# Patient Record
Sex: Female | Born: 1985 | Race: Black or African American | Hispanic: No | Marital: Single | Smoking: Former smoker
Health system: Southern US, Community
[De-identification: ages and names within clinical notes are randomized; demographics above are authoritative.]

## PROBLEM LIST (undated history)

## (undated) DIAGNOSIS — R87629 Unspecified abnormal cytological findings in specimens from vagina: Secondary | ICD-10-CM

## (undated) DIAGNOSIS — Z8619 Personal history of other infectious and parasitic diseases: Secondary | ICD-10-CM

## (undated) DIAGNOSIS — B999 Unspecified infectious disease: Secondary | ICD-10-CM

## (undated) DIAGNOSIS — J302 Other seasonal allergic rhinitis: Secondary | ICD-10-CM

## (undated) DIAGNOSIS — T7840XA Allergy, unspecified, initial encounter: Secondary | ICD-10-CM

## (undated) DIAGNOSIS — R519 Headache, unspecified: Secondary | ICD-10-CM

## (undated) HISTORY — DX: Personal history of other infectious and parasitic diseases: Z86.19

## (undated) HISTORY — PX: WISDOM TOOTH EXTRACTION: SHX21

## (undated) HISTORY — DX: Allergy, unspecified, initial encounter: T78.40XA

## (undated) HISTORY — PX: COLONOSCOPY: SHX174

## (undated) HISTORY — PX: OTHER SURGICAL HISTORY: SHX169

## (undated) HISTORY — PX: NO PAST SURGERIES: SHX2092

## (undated) HISTORY — DX: Headache, unspecified: R51.9

---

## 2015-05-27 ENCOUNTER — Encounter (HOSPITAL_COMMUNITY): Payer: Self-pay | Admitting: *Deleted

## 2015-06-03 ENCOUNTER — Ambulatory Visit (HOSPITAL_COMMUNITY)
Admission: RE | Admit: 2015-06-03 | Discharge: 2015-06-03 | Disposition: A | Discharge status: Home / Self Care | Payer: Self-pay | Source: Ambulatory Visit | Attending: Obstetrics and Gynecology | Admitting: Obstetrics and Gynecology

## 2015-06-03 ENCOUNTER — Encounter (HOSPITAL_COMMUNITY): Payer: Self-pay

## 2015-06-03 VITALS — BP 100/62 | Temp 98.2°F | Ht 66.0 in | Wt 119.0 lb

## 2015-06-03 DIAGNOSIS — R87612 Low grade squamous intraepithelial lesion on cytologic smear of cervix (LGSIL): Secondary | ICD-10-CM

## 2015-06-03 DIAGNOSIS — Z1239 Encounter for other screening for malignant neoplasm of breast: Secondary | ICD-10-CM

## 2015-06-03 HISTORY — DX: Other seasonal allergic rhinitis: J30.2

## 2015-06-03 NOTE — Patient Instructions (Addendum)
Educational materials on self breast awareness given. Explained the colposcopy to Beverly Stewart the needed follow up for her abnormal Pap smear on 05/02/15. [facility]. Appointment scheduled for Thursday, June 30, 2015 at 1245.Patient aware of appointment and will be there. Let patient know that a screening mammogram is recommended at age 30 unless clinically indicated prior. Beverly Stewart verbalized understanding.  Brannock, Kathaleen Maserhristine Poll, RN 11:08 AM

## 2015-06-03 NOTE — Progress Notes (Signed)
[facility] due to having an abnormal Pap smear on 05/02/2015 that a colposcopy is recommended for follow up.   Pap Smear:  Pap smear not completed today. Last Pap smear was 05/02/2015 at the free cervical cancer screening at the [facility] and [facility]. Ref[facility] for a colposcopy. Appointment scheduled for Thursday, June 30, 2015 at 1245. Per patient has no history of an abnormal Pap smear prior to the most recent Pap smear. Last Pap smear result is in EPIC.  Physical exam: Breasts Breasts symmetrical. No skin abnormalities bilateral breasts. No nipple retraction bilateral breasts. No nipple discharge bilateral breasts. No lymphadenopathy. No lumps palpated bilateral breasts. No complaints of pain or tenderness on exam. Screening mammogram recommended at age 30 unless clinically indicated prior.  Pelvic/Bimanual No Pap smear completed today since last Pap smear was 05/02/2015. Pap smear not indicated per BCCCP guidelines.   Smoking History: Patient has never smoked.  Patient Navigation: Patient education provided. Access to services provided for [facility] through [facility].

## 2015-06-15 ENCOUNTER — Encounter (HOSPITAL_COMMUNITY): Payer: Self-pay | Admitting: *Deleted

## 2015-06-30 ENCOUNTER — Encounter: Payer: Self-pay | Admitting: General Practice

## 2015-06-30 ENCOUNTER — Encounter: Payer: Self-pay | Admitting: Family Medicine

## 2015-06-30 ENCOUNTER — Ambulatory Visit (INDEPENDENT_AMBULATORY_CARE_PROVIDER_SITE_OTHER): Payer: Self-pay | Admitting: Family Medicine

## 2015-06-30 ENCOUNTER — Other Ambulatory Visit (HOSPITAL_COMMUNITY)
Admission: RE | Admit: 2015-06-30 | Discharge: 2015-06-30 | Disposition: A | Discharge status: Home / Self Care | Payer: Self-pay | Source: Ambulatory Visit | Attending: Family Medicine | Admitting: Family Medicine

## 2015-06-30 VITALS — Temp 98.6°F

## 2015-06-30 DIAGNOSIS — R87622 Low grade squamous intraepithelial lesion on cytologic smear of vagina (LGSIL): Secondary | ICD-10-CM

## 2015-06-30 DIAGNOSIS — N870 Mild cervical dysplasia: Secondary | ICD-10-CM | POA: Insufficient documentation

## 2015-06-30 LAB — POCT PREGNANCY, URINE: PREG TEST UR: NEGATIVE

## 2015-06-30 NOTE — Progress Notes (Signed)
PAP: [facility] Patient given informed consent, signed copy in the chart, time out was performed.  Placed in lithotomy position. Cervix viewed with speculum and colposcope after application of acetic acid.   Colposcopy adequate?  Yes - TMZ seen 360 with endocervical speculum Acetowhite lesions?6 and 12 Punctation?fine Mosaicism?  no Abnormal vasculature?  no Biopsies?6&12 ECC?yes  Patient was given post procedure instructions.

## 2015-06-30 NOTE — Patient Instructions (Signed)
COLPOSCOPY POST-PROCEDURE INSTRUCTIONS  1. You may take Ibuprofen, Aleve or Tylenol for cramping if needed.  2. If Monsel's solution was used, you will have a black discharge.  3. Light bleeding is normal.  If bleeding is heavier than your period, please call.  4. Put nothing in your vagina until the bleeding or discharge stops (usually 2 or3 days).  5. We will call you within one week with biopsy results or discuss the results at your follow-up appointment if needed. 6.  

## 2015-07-14 ENCOUNTER — Telehealth: Payer: Self-pay | Admitting: *Deleted

## 2015-07-14 NOTE — Telephone Encounter (Signed)
Called patient with colpo results, informed her to f/u in one year with PAP/cotesting. Understanding voiced.

## 2015-07-14 NOTE — Telephone Encounter (Signed)
-----   Message from Levie HeritageJacob J Stinson, DO sent at 07/06/2015  8:30 AM EDT ----- Regarding: RE: colpo results She will need a PAP in 1 year with HPV testing.  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 How do you want to proceed? Thanks! Liborio NixonJanice

## 2015-10-02 ENCOUNTER — Emergency Department (HOSPITAL_COMMUNITY): Payer: Self-pay

## 2015-10-02 ENCOUNTER — Emergency Department (HOSPITAL_COMMUNITY)
Admission: EM | Admit: 2015-10-02 | Discharge: 2015-10-02 | Disposition: A | Discharge status: Home / Self Care | Payer: Self-pay | Attending: Emergency Medicine | Admitting: Emergency Medicine

## 2015-10-02 ENCOUNTER — Encounter (HOSPITAL_COMMUNITY): Payer: Self-pay | Admitting: *Deleted

## 2015-10-02 DIAGNOSIS — R0789 Other chest pain: Secondary | ICD-10-CM | POA: Insufficient documentation

## 2015-10-02 DIAGNOSIS — Z5181 Encounter for therapeutic drug level monitoring: Secondary | ICD-10-CM | POA: Insufficient documentation

## 2015-10-02 DIAGNOSIS — R000 Tachycardia, unspecified: Secondary | ICD-10-CM | POA: Insufficient documentation

## 2015-10-02 DIAGNOSIS — F17200 Nicotine dependence, unspecified, uncomplicated: Secondary | ICD-10-CM | POA: Insufficient documentation

## 2015-10-02 LAB — CBC
HCT: 41.6 % (ref 36.0–46.0)
HEMOGLOBIN: 14.1 g/dL (ref 12.0–15.0)
MCH: 30.5 pg (ref 26.0–34.0)
MCHC: 33.9 g/dL (ref 30.0–36.0)
MCV: 89.8 fL (ref 78.0–100.0)
PLATELETS: 248 10*3/uL (ref 150–400)
RBC: 4.63 MIL/uL (ref 3.87–5.11)
RDW: 12.5 % (ref 11.5–15.5)
WBC: 6.1 10*3/uL (ref 4.0–10.5)

## 2015-10-02 LAB — COMPREHENSIVE METABOLIC PANEL
ALK PHOS: 50 U/L (ref 38–126)
ALT: 9 U/L — ABNORMAL LOW (ref 14–54)
ANION GAP: 10 (ref 5–15)
AST: 21 U/L (ref 15–41)
Albumin: 4.6 g/dL (ref 3.5–5.0)
BUN: 15 mg/dL (ref 6–20)
CALCIUM: 9.5 mg/dL (ref 8.9–10.3)
CO2: 22 mmol/L (ref 22–32)
Chloride: 107 mmol/L (ref 101–111)
Creatinine, Ser: 0.88 mg/dL (ref 0.44–1.00)
Glucose, Bld: 103 mg/dL — ABNORMAL HIGH (ref 65–99)
Potassium: 3.2 mmol/L — ABNORMAL LOW (ref 3.5–5.1)
Sodium: 139 mmol/L (ref 135–145)
Total Bilirubin: 1.9 mg/dL — ABNORMAL HIGH (ref 0.3–1.2)
Total Protein: 7.9 g/dL (ref 6.5–8.1)

## 2015-10-02 LAB — RAPID URINE DRUG SCREEN, HOSP PERFORMED
AMPHETAMINES: NOT DETECTED
BARBITURATES: NOT DETECTED
Benzodiazepines: NOT DETECTED
COCAINE: NOT DETECTED
Opiates: NOT DETECTED
TETRAHYDROCANNABINOL: POSITIVE — AB

## 2015-10-02 LAB — PREGNANCY, URINE: PREG TEST UR: NEGATIVE

## 2015-10-02 LAB — ETHANOL: Alcohol, Ethyl (B): <5 mg/dL (ref <5)

## 2015-10-02 MED ORDER — LORAZEPAM 1 MG PO TABS
1.0000 mg | ORAL_TABLET | Freq: Once | ORAL | Status: AC
  Administered 2015-10-02: 1 mg via ORAL
  Filled 2015-10-02: qty 1

## 2015-10-02 MED ORDER — SODIUM CHLORIDE 0.9 % IV SOLN
1000.0000 mL | Freq: Once | INTRAVENOUS | Status: AC
  Administered 2015-10-02: 1000 mL via INTRAVENOUS

## 2015-10-02 MED ORDER — ALBUTEROL SULFATE (2.5 MG/3ML) 0.083% IN NEBU: 5.0000 mg | INHALATION_SOLUTION | Freq: Once | RESPIRATORY_TRACT | Status: DC

## 2015-10-02 MED ORDER — SODIUM CHLORIDE 0.9 % IV SOLN: 1000.0000 mL | INTRAVENOUS | Status: DC

## 2015-10-02 NOTE — ED Notes (Signed)
Patient cannot give urine sample at this time. Patient will try after some fluids.

## 2015-10-02 NOTE — ED Notes (Addendum)
Pt states she feels like her heart is racing and short of breath, HR 131. Pt went out last night, brought a cookie home, ate it this morning, went to store, was talking to friend, felt strange, vague recall of driving home, called mother to come to hospital.

## 2015-10-02 NOTE — ED Notes (Signed)
Pt reminded of need for urine specimen 

## 2015-10-02 NOTE — ED Provider Notes (Signed)
CSN: [id number]     Arrival date & time 10/02/15  1225 History   First MD Initiated Contact with Patient 10/02/15 1233     Chief Complaint  Patient presents with  . Tachycardia    131 HR  . Shortness of Breath     (Consider location/radiation/quality/duration/timing/severity/associated sxs/prior Treatment) HPI   This is a 30 year old female who presents for evaluation of feeling like her heart is racing. The patient reports feeling starting to feel "strange" this morning after eating a cookie she brought home from a party the night before. She has never experienced this before. Does not know if others at the party had similar symptoms. Denies family history of early sudden cardiac death.    Past Medical History  Diagnosis Date  . Seasonal allergies    History reviewed. No pertinent past surgical history. Family History  Problem Relation Age of Onset  . Diabetes Father   . Hypertension Maternal Grandmother   . Diabetes Maternal Grandmother   . Stroke Maternal Grandfather    Social History  Substance Use Topics  . Smoking status: Current Some Day Smoker  . Smokeless tobacco: Never Used  . Alcohol Use: Yes     Comment: ocassionally   OB History    Gravida Para Term Preterm AB TAB SAB Ectopic Multiple Living   Review of Systems  Constitutional: Negative for fever and chills.  HENT: Negative for rhinorrhea.   Eyes: Negative for photophobia and redness.  Respiratory: Positive for chest tightness and shortness of breath. Negative for cough and wheezing.   Cardiovascular: Positive for chest pain and palpitations.  Gastrointestinal: Negative for nausea, vomiting, abdominal pain, diarrhea and constipation.  Genitourinary: Negative for dysuria and difficulty urinating.  Psychiatric/Behavioral: Positive for decreased concentration. Negative for confusion and agitation. The patient is nervous/anxious.       Allergies  Review of patient's allergies indicates  no known allergies.  Home Medications   Prior to Admission medications   Not on File   BP 156/90 mmHg  Pulse 138  Temp(Src) 98 F (36.7 C) (Oral)  Resp 22  SpO2 100%  LMP 09/25/2015 Physical Exam  Constitutional: She is oriented to person, place, and time. She appears well-developed and well-nourished.  HENT:  Head: Normocephalic and atraumatic.  Eyes: Pupils are equal, round, and reactive to light. Left eye exhibits no discharge.  Pupils 3mm  Cardiovascular:  Tachycardic, rate 140's. No murmur.  Pulmonary/Chest: Effort normal and breath sounds normal. No respiratory distress. She has no wheezes. She has no rales.  Abdominal: Bowel sounds are normal.  Neurological: She is alert and oriented to person, place, and time.  Skin: Skin is warm and dry. She is not diaphoretic.    ED Course  Procedures (including critical care time) Labs Review Labs Reviewed - No data to display  Imaging Review Dg Chest 2 View  10/02/2015  CLINICAL DATA:  Pt states she feels like her heart is racing and short of breath, HR 131. Pt went out last night, brought a cookie home, ate it this morning, went to store, was talking to friend, felt strange, vague recall of driving home. EXAM: CHEST  2 VIEW COMPARISON:  None. FINDINGS: The heart size and mediastinal contours are within normal limits. Both lungs are clear. The visualized skeletal structures are unremarkable. IMPRESSION: No active cardiopulmonary disease. Electronically Signed   By: Elige Ko   On: 10/02/2015 13:28  I have personally reviewed and evaluated these images and lab results as part of my medical decision-making.   EKG Interpretation None      MDM   Final diagnoses:  Tachycardia        Shanna Un, DO 10/02/15 1533

## 2015-10-02 NOTE — ED Notes (Signed)
RN is starting an IV line and drawing blood work 

## 2015-10-02 NOTE — ED Notes (Signed)
Patient states that she can not go and needs more fluids.

## 2015-10-02 NOTE — ED Provider Notes (Signed)
I saw and evaluated the patient, reviewed the resident's note and I agree with the findings and plan.   EKG Interpretation None     ED ECG REPORT   Date: 10/02/2015  Rate: 136  Rhythm: sinus tachycardia  QRS Axis: normal  Intervals: normal  ST/T Wave abnormalities: nonspecific ST changes  Conduction Disutrbances:none  Narrative Interpretation:   Old EKG Reviewed: none available  I have personally reviewed the EKG tracing and agree with the computerized printout as noted.  Patient here for sudden onset of tachycardia and shortness of breath after eating a brownie. Possibly laced with illicit drugs. Has some chest tightness which she contributes to be anxious. Will treat with IV fluids and Ativan as well as check drug screen.  Lorre NickAnthony Taia Bramlett, MD 10/02/15 1316

## 2016-04-26 ENCOUNTER — Ambulatory Visit (INDEPENDENT_AMBULATORY_CARE_PROVIDER_SITE_OTHER): Payer: 59 | Admitting: Gastroenterology

## 2016-04-26 ENCOUNTER — Encounter: Payer: Self-pay | Admitting: Gastroenterology

## 2016-04-26 ENCOUNTER — Other Ambulatory Visit (INDEPENDENT_AMBULATORY_CARE_PROVIDER_SITE_OTHER): Payer: 59

## 2016-04-26 ENCOUNTER — Encounter (INDEPENDENT_AMBULATORY_CARE_PROVIDER_SITE_OTHER): Payer: Self-pay

## 2016-04-26 VITALS — BP 108/74 | HR 100 | Ht 65.75 in | Wt 119.4 lb

## 2016-04-26 DIAGNOSIS — R197 Diarrhea, unspecified: Secondary | ICD-10-CM

## 2016-04-26 DIAGNOSIS — R1033 Periumbilical pain: Secondary | ICD-10-CM | POA: Diagnosis not present

## 2016-04-26 LAB — CBC WITH DIFFERENTIAL/PLATELET
Basophils Absolute: 0 10*3/uL (ref 0.0–0.1)
Basophils Relative: 0.5 % (ref 0.0–3.0)
Eosinophils Absolute: 0.3 10*3/uL (ref 0.0–0.7)
Eosinophils Relative: 3.7 % (ref 0.0–5.0)
HEMATOCRIT: 40.5 % (ref 36.0–46.0)
HEMOGLOBIN: 13.4 g/dL (ref 12.0–15.0)
LYMPHS PCT: 20.2 % (ref 12.0–46.0)
Lymphs Abs: 1.9 10*3/uL (ref 0.7–4.0)
MCHC: 33 g/dL (ref 30.0–36.0)
MCV: 92 fl (ref 78.0–100.0)
MONOS PCT: 7.5 % (ref 3.0–12.0)
Monocytes Absolute: 0.7 10*3/uL (ref 0.1–1.0)
Neutro Abs: 6.5 10*3/uL (ref 1.4–7.7)
Neutrophils Relative %: 68.1 % (ref 43.0–77.0)
Platelets: 234 10*3/uL (ref 150.0–400.0)
RBC: 4.41 Mil/uL (ref 3.87–5.11)
RDW: 13 % (ref 11.5–15.5)
WBC: 9.5 10*3/uL (ref 4.0–10.5)

## 2016-04-26 LAB — COMPREHENSIVE METABOLIC PANEL
ALBUMIN: 4.2 g/dL (ref 3.5–5.2)
ALK PHOS: 47 U/L (ref 39–117)
ALT: 7 U/L (ref 0–35)
AST: 12 U/L (ref 0–37)
BILIRUBIN TOTAL: 0.8 mg/dL (ref 0.2–1.2)
BUN: 15 mg/dL (ref 6–23)
CO2: 27 mEq/L (ref 19–32)
Calcium: 9.4 mg/dL (ref 8.4–10.5)
Chloride: 110 mEq/L (ref 96–112)
Creatinine, Ser: 0.8 mg/dL (ref 0.40–1.20)
GFR: 109.07 mL/min (ref >60.00)
Glucose, Bld: 103 mg/dL — ABNORMAL HIGH (ref 70–99)
POTASSIUM: 4 meq/L (ref 3.5–5.1)
Sodium: 141 mEq/L (ref 135–145)
TOTAL PROTEIN: 7 g/dL (ref 6.0–8.3)

## 2016-04-26 LAB — IGA: IgA: 188 mg/dL (ref 68–378)

## 2016-04-26 LAB — TSH: TSH: 0.68 u[IU]/mL (ref 0.35–4.50)

## 2016-04-26 MED ORDER — NA SULFATE-K SULFATE-MG SULF 17.5-3.13-1.6 GM/177ML PO SOLN: 1.0000 | Freq: Once | ORAL | 0 refills | Status: AC

## 2016-04-26 MED ORDER — HYOSCYAMINE SULFATE 0.125 MG SL SUBL: 0.1250 mg | SUBLINGUAL_TABLET | Freq: Four times a day (QID) | SUBLINGUAL | 2 refills | Status: DC | PRN

## 2016-04-26 NOTE — Progress Notes (Signed)
[name]:  History: Beverly Stewart 04/26/2016  Referring physician: No primary care provider on file.  Reason for consult/chief complaint: Bloated; Diarrhea; and stomach burning (really bad burning after eating in lower abd)   Subjective  HPI:  This is a 31 year old woman self-referred for about a year of abdominal pain. It occurs frequently, might last several days or a week, and is of a burning quality in the entire infraumbilical area. When it occurs she will often have diarrhea and there may be urgency and bloating but no rectal bleeding. Her appetite is been good and she believes her weight is stable. She had no preceding triggers such as sick contacts, indications or travel. She has noticed no clear or consistent triggers.   ROS:  Review of Systems  Constitutional: Negative for appetite change and unexpected weight change.  HENT: Negative for mouth sores and voice change.   Eyes: Negative for pain and redness.  Respiratory: Negative for cough and shortness of breath.   Cardiovascular: Negative for chest pain and palpitations.  Genitourinary: Negative for dysuria and hematuria.  Musculoskeletal: Negative for arthralgias and myalgias.  Skin: Negative for pallor and rash.  Neurological: Negative for weakness and headaches.  Hematological: Negative for adenopathy.     Past Medical History: Past Medical History:  Diagnosis Date  . Seasonal allergies      Past Surgical History: Past Surgical History:  Procedure Laterality Date  . NO PAST SURGERIES       Family History: Family History  Problem Relation Age of Onset  . Diabetes Father   . Hypertension Maternal Grandmother   . Diabetes Maternal Grandmother   . Kidney failure Maternal Grandmother     dialysis  . Stroke Maternal Grandfather   . Colon cancer Maternal Grandfather     mets   No known IBD  Social History: Social History   Social History  . Marital status: Single     Spouse name: N/A  . Number of children: 0  . Years of education: N/A   Occupational History  . [employer]    Social History Main Topics  . Smoking status: Former Smoker    Quit date: 04/27/2015  . Smokeless tobacco: Never Used  . Alcohol use Yes     Comment: ocassionally  . Drug use: No  . Sexual activity: Yes    Birth control/ protection: None, Condom   Other Topics Concern  . None   Social History Narrative  . None    Allergies: No Known Allergies  Outpatient Meds: Current Outpatient Prescriptions  Medication Sig Dispense Refill  . DM-Phenylephrine-Acetaminophen (ROBITUSSIN COLD+FLU DAYTIME PO) Take 1 Dose by mouth every 4 (four) hours.    . hyoscyamine (LEVSIN SL) 0.125 MG SL tablet Place 1 tablet (0.125 mg total) under the tongue every 6 (six) hours as needed. 30 tablet 2  . Na Sulfate-K Sulfate-Mg Sulf 17.5-3.13-1.6 GM/180ML SOLN Take 1 kit by mouth once. 354 mL 0   No current facility-administered medications for this visit.       ___________________________________________________________________ Objective   Exam:  BP 108/74 (BP Location: Left Arm, Patient Position: Sitting, Cuff Size: Normal)   Pulse 100   Ht 5' 5.75" (1.67 m) Comment: height measured without shoes  Wt 119 lb 6 oz (54.1 kg)   LMP 03/27/2016   BMI 19.42 kg/m    General: this is a(n) petite and thin young woman   Eyes: sclera anicteric, no redness  ENT: oral mucosa moist without lesions, no cervical  or supraclavicular lymphadenopathy, good dentition  CV: RRR without murmur, S1/S2, no JVD, no peripheral edema  Resp: clear to auscultation bilaterally, normal RR and effort noted  GI: soft, no tenderness, with active bowel sounds. No guarding or palpable organomegaly noted.  Skin; warm and dry, no rash or jaundice noted  Neuro: awake, alert and oriented x 3. Normal gross motor function and fluent speech  No data to review, she does not have a PCP and has had no  workup.   Assessment: Encounter Diagnoses  Name Primary?  . Diarrhea, unspecified type Yes  . Periumbilical abdominal pain     Symptoms somewhat difficult to characterize, sounds most likely IBS, less likely IBD or sprue.   Plan:  CBC, CMP, TTG antibody Trial of hyoscyamine Colonoscopy  Thank you for the courtesy of this consult.  Please call me with any questions or concerns.  Nelida Meuse III  CC: No primary care provider on file.

## 2016-04-26 NOTE — Patient Instructions (Signed)
If you are age 29 or older, your body mass index should be between 23-30. Your Body mass index is 19.42 kg/m. If this is out of the aforementioned range listed, please consider follow up with your Primary Care Provider.  If you are age 29 or younger, your body mass index should be between 19-25. Your Body mass index is 19.42 kg/m. If this is out of the aformentioned range listed, please consider follow up with your Primary Care Provider.   You have been scheduled for a colonoscopy. Please follow written instructions given to you at your visit today.  Please pick up your prep supplies at the pharmacy within the next 1-3 days. If you use inhalers (even only as needed), please bring them with you on the day of your procedure. Your physician has requested that you go to www.startemmi.com and enter the access code given to you at your visit today. This web site gives a general overview about your procedure. However, you should still follow specific instructions given to you by our office regarding your preparation for the procedure.  Thank you for choosing me and [facility].  Dr. Ellwood DenseH. Danis

## 2016-04-27 LAB — TISSUE TRANSGLUTAMINASE, IGA: TISSUE TRANSGLUTAMINASE AB, IGA: 1 U/mL (ref <4)

## 2016-04-30 NOTE — Progress Notes (Signed)
New prep instructions sent to patient.

## 2016-05-17 ENCOUNTER — Encounter: Payer: Self-pay | Admitting: Gastroenterology

## 2016-05-31 ENCOUNTER — Encounter: Payer: 59 | Admitting: Gastroenterology

## 2016-06-14 ENCOUNTER — Telehealth: Payer: Self-pay | Admitting: Gastroenterology

## 2016-06-14 ENCOUNTER — Ambulatory Visit (HOSPITAL_COMMUNITY): Payer: 59

## 2016-06-14 NOTE — Telephone Encounter (Signed)
Patient advised to stay away from any food triggers, may take Tums to see if this helps with the burning sensation. She understands that until she has her colonoscopy Dr. Myrtie Neitheranis will not be able to offer further diagnosis.

## 2016-06-14 NOTE — Telephone Encounter (Signed)
Patient states that the Levsin had been helping, but since last Wednesday she states her stomach feels like "it's on fire", burning, only able to get about one hour of relief after taking the Levsin. She is taking it every 6 hours. She is not taking any other medications. She is able to eat, drink okay, is having bowel movements which swing from constipation to diarrhea to normal. She is scheduled for colonoscopy on 4/13. Please advise.

## 2016-06-14 NOTE — Telephone Encounter (Signed)
I'm afraid I do not know what else to offer until colonoscopy to make a more certain diagnosis.

## 2016-06-29 ENCOUNTER — Encounter: Payer: Self-pay | Admitting: Gastroenterology

## 2016-06-29 ENCOUNTER — Ambulatory Visit (AMBULATORY_SURGERY_CENTER): Payer: 59 | Admitting: Gastroenterology

## 2016-06-29 VITALS — BP 107/70 | HR 68 | Temp 99.6°F | Resp 16 | Ht 65.0 in | Wt 119.0 lb

## 2016-06-29 DIAGNOSIS — R1032 Left lower quadrant pain: Secondary | ICD-10-CM

## 2016-06-29 DIAGNOSIS — R197 Diarrhea, unspecified: Secondary | ICD-10-CM | POA: Diagnosis not present

## 2016-06-29 MED ORDER — SODIUM CHLORIDE 0.9 % IV SOLN: 500.0000 mL | INTRAVENOUS | Status: DC

## 2016-06-29 MED ORDER — CILIDINIUM-CHLORDIAZEPOXIDE 2.5-5 MG PO CAPS: 1.0000 | ORAL_CAPSULE | Freq: Two times a day (BID) | ORAL | 1 refills | Status: DC | PRN

## 2016-06-29 NOTE — Progress Notes (Signed)
To recovery, report to Hines, RN, VSS 

## 2016-06-29 NOTE — Patient Instructions (Signed)
YOU HAD AN ENDOSCOPIC PROCEDURE TODAY AT THE [facility]:   Refer to the procedure report that was given to you for any specific questions about what was found during the examination.  If the procedure report does not answer your questions, please call your gastroenterologist to clarify.  If you requested that your care partner not be given the details of your procedure findings, then the procedure report has been included in a sealed envelope for you to review at your convenience later.  YOU SHOULD EXPECT: Some feelings of bloating in the abdomen. Passage of more gas than usual.  Walking can help get rid of the air that was put into your GI tract during the procedure and reduce the bloating. If you had a lower endoscopy (such as a colonoscopy or flexible sigmoidoscopy) you may notice spotting of blood in your stool or on the toilet paper. If you underwent a bowel prep for your procedure, you may not have a normal bowel movement for a few days.  Please Note:  You might notice some irritation and congestion in your nose or some drainage.  This is from the oxygen used during your procedure.  There is no need for concern and it should clear up in a day or so.  SYMPTOMS TO REPORT IMMEDIATELY:   Following lower endoscopy (colonoscopy or flexible sigmoidoscopy):  Excessive amounts of blood in the stool  Significant tenderness or worsening of abdominal pains  Swelling of the abdomen that is new, acute  Fever of 100F or higher  For urgent or emergent issues, a gastroenterologist can be reached at any hour by calling [phone].   DIET:  We do recommend a small meal at first, but then you may proceed to your regular diet.  Drink plenty of fluids but you should avoid alcoholic beverages for 24 hours.  MEDICATIONS:  Use Librax one capsule by mouth twice daily as needed for symptoms. When first taking the medicine, take it at home when not working the remainder of the day in case of  sedation side effect. Prescription sent to patient's pharmacy. Otherwise, continue present medications.  ACTIVITY:  You should plan to take it easy for the rest of today and you should NOT DRIVE or use heavy machinery until tomorrow (because of the sedation medicines used during the test).    FOLLOW UP: Our staff will call the number listed on your records the next business day following your procedure to check on you and address any questions or concerns that you may have regarding the information given to you following your procedure. If we do not reach you, we will leave a message.  However, if you are feeling well and you are not experiencing any problems, there is no need to return our call.  We will assume that you have returned to your regular daily activities without incident.  If any biopsies were taken you will be contacted by phone or by letter within the next 1-3 weeks.  Please call us at [phone] if you have not heard about the biopsies in 3 weeks.   Please see handouts given to you by your recovery nurse.  Thank you for allowing [place] to provide for your healthcare needs today.  SIGNATURES/CONFIDENTIALITY: You and/or your care partner have signed paperwork which will be entered into your electronic medical record.  These signatures attest to the fact that that the information above on your After Visit Summary has been reviewed and is understood.  Full responsibility  of the confidentiality of this discharge information lies with you and/or your care-partner.

## 2016-06-29 NOTE — Op Note (Signed)
[facility] Patient Name: Beverly Stewart Procedure Date: 06/29/2016 4:09 PM MRN: [id number] Endoscopist: Sherilyn Cooter L. Myrtie Neither , MD Age: 31 Referring MD:  Date of Birth: April 14, 1985 Gender: Female Account #: [id number] Procedure:                Colonoscopy Indications:              Abdominal pain in the left lower quadrant, Chronic                            diarrhea Medicines:                Monitored Anesthesia Care Procedure:                Pre-Anesthesia Assessment:                           - Prior to the procedure, a History and Physical                            was performed, and patient medications and                            allergies were reviewed. The patient's tolerance of                            previous anesthesia was also reviewed. The risks                            and benefits of the procedure and the sedation                            options and risks were discussed with the patient.                            All questions were answered, and informed consent                            was obtained. Prior Anticoagulants: The patient has                            taken no previous anticoagulant or antiplatelet                            agents. ASA Grade Assessment: II - A patient with                            mild systemic disease. After reviewing the risks                            and benefits, the patient was deemed in                            satisfactory condition to undergo the procedure.  After obtaining informed consent, the colonoscope                            was passed under direct vision. Throughout the                            procedure, the patient's blood pressure, pulse, and                            oxygen saturations were monitored continuously. The                            Colonoscope was introduced through the anus and                            advanced to the the terminal ileum. The colonoscopy                            was performed without difficulty. The patient                            tolerated the procedure well. The quality of the                            bowel preparation was good. The terminal ileum,                            ileocecal valve, appendiceal orifice, and rectum                            were photographed. The quality of the bowel                            preparation was evaluated using the BBPS [facility]                            Bowel Preparation Scale) with scores of: Right                            Colon = 3, Transverse Colon = 3 and Left Colon = 3                            (entire mucosa seen well with no residual staining,                            small fragments of stool or opaque liquid). The                            total BBPS score equals 9. The bowel preparation                            used was SUPREP. Scope In: 4:22:08 PM Scope Out: 4:32:52 PM Scope Withdrawal Time: 0 hours  6 minutes 44 seconds  Total Procedure Duration: 0 hours 10 minutes 44 seconds  Findings:                 The perianal and digital rectal examinations were                            normal.                           The terminal ileum appeared normal.                           A few medium-mouthed diverticula were found in the                            right colon.                           The exam was otherwise without abnormality on                            direct and retroflexion views. Complications:            No immediate complications. Estimated Blood Loss:     Estimated blood loss: none. Impression:               - The examined portion of the ileum was normal.                           - Diverticulosis in the right colon.                           - The examination was otherwise normal on direct                            and retroflexion views.                           - No specimens collected. Recommendation:           - Patient has a contact  number available for                            emergencies. The signs and symptoms of potential                            delayed complications were discussed with the                            patient. Return to normal activities tomorrow.                            Written discharge instructions were provided to the                            patient.                           -  Resume previous diet.                           - Use Librax one capsule PO twice daily as needed                            for symptoms.                           Disp #30 tabs, one refill                           When first taking the medicine, take it at home                            when not working the remainder of the day in case                            of sedation side effect.                           Follow up in my clinic in a few weeks.                           - Continue present medications.                           - No recommendation at this time regarding repeat                            colonoscopy due to age. Kaine Mcquillen L. Myrtie Neither, MD 06/29/2016 4:40:19 PM This report has been signed electronically.

## 2016-06-29 NOTE — Progress Notes (Signed)
Patient stating colon results brown liquid with mushy. Unable to see through results in the toilet. Informed Dr. Myrtie Neither.

## 2016-07-02 ENCOUNTER — Telehealth: Payer: Self-pay | Admitting: *Deleted

## 2016-07-02 NOTE — Telephone Encounter (Signed)
  Follow up Call-  Call back number 06/29/2016  Post procedure Call Back phone  # [phone]  Permission to leave phone message Yes     Patient questions:  Do you have a fever, pain , or abdominal swelling? No. Pain Score  0 *  Have you tolerated food without any problems? Yes.    Have you been able to return to your normal activities? Yes.    Do you have any questions about your discharge instructions: Diet   No. Medications  No. Follow up visit  No.  Do you have questions or concerns about your Care? No.  Actions: * If pain score is 4 or above: No action needed, pain <4.

## 2016-07-05 ENCOUNTER — Ambulatory Visit (INDEPENDENT_AMBULATORY_CARE_PROVIDER_SITE_OTHER): Payer: 59 | Admitting: Student

## 2016-07-05 ENCOUNTER — Encounter: Payer: Self-pay | Admitting: Student

## 2016-07-05 VITALS — BP 116/78 | HR 72 | Ht 63.0 in | Wt 116.3 lb

## 2016-07-05 DIAGNOSIS — Z1151 Encounter for screening for human papillomavirus (HPV): Secondary | ICD-10-CM | POA: Diagnosis not present

## 2016-07-05 DIAGNOSIS — N870 Mild cervical dysplasia: Secondary | ICD-10-CM | POA: Insufficient documentation

## 2016-07-05 DIAGNOSIS — Z87410 Personal history of cervical dysplasia: Secondary | ICD-10-CM

## 2016-07-05 DIAGNOSIS — Z8742 Personal history of other diseases of the female genital tract: Secondary | ICD-10-CM

## 2016-07-05 DIAGNOSIS — Z124 Encounter for screening for malignant neoplasm of cervix: Secondary | ICD-10-CM

## 2016-07-05 NOTE — Patient Instructions (Signed)

## 2016-07-05 NOTE — Progress Notes (Addendum)
Patient ID: Beverly Stewart, female   DOB: 24-Jul-1985, 31 y.o.   MRN: [id number]  Chief Complaint  Patient presents with  . Gynecologic Exam    Pap smear    HPI Beverly Stewart is a 31 y.o. female presenting for a pap smear after colpo in April 2017.  HPI Requesting pap smear; patient has a history of abnormal pap smear in April 2017.  Past Medical History:  Diagnosis Date  . Allergy   . Seasonal allergies     Past Surgical History:  Procedure Laterality Date  . NO PAST SURGERIES      Family History  Problem Relation Age of Onset  . Diabetes Father   . Hypertension Maternal Grandmother   . Diabetes Maternal Grandmother   . Kidney failure Maternal Grandmother     dialysis  . Stroke Maternal Grandfather   . Colon cancer Maternal Grandfather     mets  . Breast cancer Other     Social History Social History  Substance Use Topics  . Smoking status: Former Smoker    Quit date: 04/27/2015  . Smokeless tobacco: Never Used  . Alcohol use Yes     Comment: ocassionally    No Known Allergies  Current Outpatient Prescriptions  Medication Sig Dispense Refill  . clidinium-chlordiazePOXIDE (LIBRAX) 5-2.5 MG capsule Take 1 capsule by mouth 2 (two) times daily as needed (as needed for symptoms). 30 capsule 1  . hyoscyamine (LEVSIN SL) 0.125 MG SL tablet Place 1 tablet (0.125 mg total) under the tongue every 6 (six) hours as needed. 30 tablet 2  . alum & mag hydroxide-simeth (MAALOX/MYLANTA) 200-200-20 MG/5ML suspension Take by mouth every 6 (six) hours as needed for indigestion or heartburn.    Marland Kitchen DM-Phenylephrine-Acetaminophen (ROBITUSSIN COLD+FLU DAYTIME PO) Take 1 Dose by mouth every 4 (four) hours.    . Multiple Vitamin (MULTIVITAMIN) tablet Take 1 tablet by mouth daily.     Current Facility-Administered Medications  Medication Dose Route Frequency Provider Last Rate Last Dose  . 0.9 %  sodium chloride infusion  500 mL Intravenous Continuous Charlie Pitter III, MD        Review of  Systems Review of Systems  Constitutional: Negative.   HENT: Negative.   Respiratory: Negative.   Cardiovascular: Negative.   Gastrointestinal: Negative.   Genitourinary: Negative.   Neurological: Negative.     Blood pressure 116/78, pulse 72, height  (1.6 m), weight 116 lb 4.8 oz (52.8 kg), last menstrual period 06/14/2016.  Physical Exam Physical Exam  Constitutional: She is oriented to person, place, and time. She appears well-developed and well-nourished.  HENT:  Head: Normocephalic.  Neck: Normal range of motion.  Pulmonary/Chest: Breath sounds normal.  Abdominal: Soft. Bowel sounds are normal.  Genitourinary: No vaginal discharge found.  Genitourinary Comments: NEFG; no vaginal lesions. Cervix is pink with no lesions; no CMT.   Musculoskeletal: Normal range of motion.  Neurological: She is alert and oriented to person, place, and time.  Skin: Skin is warm and dry.  Psychiatric: She has a normal mood and affect.    Assessment    Healthy GYN exam    Plan    1. Pap smear done today with HPV co-testing 2. Patient will follow up with [facility] for her well-woman care and STI screening       Charlesetta Garibaldi Emryn Flanery CNM 07/05/2016, 4:00 PM

## 2016-07-10 LAB — CYTOLOGY - PAP

## 2016-07-12 ENCOUNTER — Telehealth: Payer: Self-pay | Admitting: *Deleted

## 2016-07-12 NOTE — Telephone Encounter (Signed)
Called patient and spoke with her about pap results, need for colposcopy. Patient voiced understanding and would like the front office to call her to schedule. Will inbox them.

## 2016-07-12 NOTE — Telephone Encounter (Signed)
-----   Message from Beverly Stewart, CNM sent at 07/12/2016 10:22 AM EDT ----- Regarding: Patient needs colpo Hello! Do you mind calling this patient to schedule her for a follow-up colpo and go over her results. I tried to reach her today but no answer; I told her that someone would be calling her to go over her results and next steps. Thank you!  [name]

## 2016-07-13 ENCOUNTER — Telehealth: Payer: Self-pay | Admitting: General Practice

## 2016-07-30 ENCOUNTER — Encounter: Payer: Self-pay | Admitting: Family Medicine

## 2016-07-30 ENCOUNTER — Ambulatory Visit (INDEPENDENT_AMBULATORY_CARE_PROVIDER_SITE_OTHER): Payer: 59 | Admitting: Family Medicine

## 2016-07-30 VITALS — BP 121/67 | HR 89 | Wt 115.2 lb

## 2016-07-30 DIAGNOSIS — Z3202 Encounter for pregnancy test, result negative: Secondary | ICD-10-CM | POA: Diagnosis not present

## 2016-07-30 DIAGNOSIS — R87622 Low grade squamous intraepithelial lesion on cytologic smear of vagina (LGSIL): Secondary | ICD-10-CM

## 2016-07-30 LAB — POCT PREGNANCY, URINE: Preg Test, Ur: NEGATIVE

## 2016-07-30 NOTE — Progress Notes (Signed)
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  Placed in lithotomy position. Cervix viewed with speculum and colposcope after application of acetic acid.   Colposcopy adequate?  No. TMZ not seen Acetowhite lesions? 6, 12 o'clock Punctation? no Mosaicism?  no Abnormal vasculature?  no Biopsies? yes ECC? yes   Patient was given post procedure instructions.  Will call patient with results.

## 2016-07-30 NOTE — Patient Instructions (Signed)
Colposcopy, Care After This sheet gives you information about how to care for yourself after your procedure. Your doctor may also give you more specific instructions. If you have problems or questions, contact your doctor. What can I expect after the procedure? If you did not have a tissue sample removed (did not have a biopsy), you may only have some spotting for a few days. You can go back to your normal activities. If you had a tissue sample removed, it is common to have:  Soreness and pain. This may last for a few days.  Light-headedness.  Mild bleeding from your vagina or dark-colored, grainy discharge from your vagina. This may last for a few days. You may need to wear a sanitary pad.  Spotting for at least 48 hours after the procedure. Follow these instructions at home:  Take over-the-counter and prescription medicines only as told by your doctor. Ask your doctor what medicines you can start taking again. This is very important if you take blood-thinning medicine.  Do not drive or use heavy machinery while taking prescription pain medicine.  For 3 days, or as long as your doctor tells you, avoid:  Douching.  Using tampons.  Having sex.  If you use birth control (contraception), keep using it.  Limit activity for the first day after the procedure. Ask your doctor what activities are safe for you.  It is up to you to get the results of your procedure. Ask your doctor when your results will be ready.  Keep all follow-up visits as told by your doctor. This is important. Contact a doctor if:  You get a skin rash. Get help right away if:  You are bleeding a lot from your vagina. It is a lot of bleeding if you are using more than one pad an hour for 2 hours in a row.  You have clumps of blood (blood clots) coming from your vagina.  You have a fever.  You have chills  You have pain in your lower belly (pelvic area).  You have signs of infection, such as vaginal  discharge that is:  Different than usual.  Yellow.  Bad-smelling.  You have very pain or cramps in your lower belly that do not get better with medicine.  You feel light-headed.  You feel dizzy.  You pass out (faint). Summary  If you did not have a tissue sample removed (did not have a biopsy), you may only have some spotting for a few days. You can go back to your normal activities.  If you had a tissue sample removed, it is common to have mild pain and spotting for 48 hours.  For 3 days, or as long as your doctor tells you, avoid douching, using tampons and having sex.  Get help right away if you have bleeding, very bad pain, or signs of infection. This information is not intended to replace advice given to you by your health care provider. Make sure you discuss any questions you have with your health care provider. Document Released: 08/22/2007 Document Revised: 11/23/2015 Document Reviewed: 11/23/2015 Elsevier Interactive Patient Education  2017 Elsevier Inc.  

## 2016-07-31 ENCOUNTER — Other Ambulatory Visit (HOSPITAL_COMMUNITY)
Admission: RE | Admit: 2016-07-31 | Discharge: 2016-07-31 | Disposition: A | Discharge status: Home / Self Care | Payer: 59 | Source: Ambulatory Visit | Attending: Family Medicine | Admitting: Family Medicine

## 2016-07-31 DIAGNOSIS — R87619 Unspecified abnormal cytological findings in specimens from cervix uteri: Secondary | ICD-10-CM | POA: Diagnosis present

## 2016-07-31 NOTE — Addendum Note (Signed)
Addended by: Jill SideAY, Samhitha Rosen L on: 07/31/2016 11:56 AM   Modules accepted: Orders

## 2016-08-06 ENCOUNTER — Encounter: Payer: Self-pay | Admitting: Family Medicine

## 2017-03-25 ENCOUNTER — Ambulatory Visit: Payer: 59 | Admitting: Family Medicine

## 2017-04-08 ENCOUNTER — Encounter: Payer: Self-pay | Admitting: Family Medicine

## 2017-04-08 ENCOUNTER — Ambulatory Visit (INDEPENDENT_AMBULATORY_CARE_PROVIDER_SITE_OTHER): Payer: 59 | Admitting: Family Medicine

## 2017-04-08 VITALS — BP 119/66 | HR 72 | Wt 116.9 lb

## 2017-04-08 DIAGNOSIS — Z01419 Encounter for gynecological examination (general) (routine) without abnormal findings: Secondary | ICD-10-CM

## 2017-04-08 DIAGNOSIS — Z124 Encounter for screening for malignant neoplasm of cervix: Secondary | ICD-10-CM

## 2017-04-08 DIAGNOSIS — Z1151 Encounter for screening for human papillomavirus (HPV): Secondary | ICD-10-CM | POA: Diagnosis not present

## 2017-04-08 DIAGNOSIS — Z113 Encounter for screening for infections with a predominantly sexual mode of transmission: Secondary | ICD-10-CM

## 2017-04-08 DIAGNOSIS — N870 Mild cervical dysplasia: Secondary | ICD-10-CM

## 2017-04-08 NOTE — Progress Notes (Signed)
   Subjective:    Patient ID: Beverly Stewart, female    DOB: 1985-04-05, 32 y.o.   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 PAPs LSIL with colpo CIN1. No other complaints or problems.  Review of Systems     Objective:   Physical Exam  Constitutional: She appears well-developed and well-nourished.  Neck: Normal range of motion. Neck supple. No thyromegaly present.  Cardiovascular: Normal rate, regular rhythm and normal heart sounds.  Pulmonary/Chest: Effort normal and breath sounds normal. No respiratory distress. She has no wheezes. She has no rales.  Abdominal: Soft. Bowel sounds are normal. She exhibits no distension and no mass. There is no tenderness. There is no rebound and no guarding. Hernia confirmed negative in the right inguinal area and confirmed negative in the left inguinal area.  Genitourinary: There is no rash, tenderness, lesion or injury on the right labia. There is no rash, tenderness, lesion or injury on the left labia. Cervix exhibits no discharge and no friability. No tenderness in the vagina. No signs of injury around the vagina. No vaginal discharge found.  Lymphadenopathy:       Left: No inguinal adenopathy present.  Neurological: She is alert.  Skin: Skin is warm and dry.  Psychiatric: She has a normal mood and affect. Her behavior is normal. Thought content normal.      Assessment & Plan:  1. CIN I (cervical intraepithelial neoplasia I) - Cytology - PAP

## 2017-04-15 LAB — CYTOLOGY - PAP
Diagnosis: UNDETERMINED — AB
HPV 16/18/45 genotyping: NEGATIVE
HPV: DETECTED — AB

## 2017-05-03 ENCOUNTER — Telehealth: Payer: Self-pay | Admitting: General Practice

## 2017-05-03 NOTE — Telephone Encounter (Signed)
Patient called and left message requesting pap results.  Per Dr Adrian BlackwaterStinson, Patient has abnormal PAP - needs colposcopy.  Called patient, no answer- left message stating we are trying to reach you to return your phone call, please call us back.

## 2017-05-15 NOTE — Telephone Encounter (Signed)
Called patient & informed her of pap results & need for colposcopy. Informed patient of colpo appt. Patient verbalized understanding to all & had no questions

## 2017-05-20 ENCOUNTER — Encounter: Payer: Self-pay | Admitting: Family Medicine

## 2017-05-20 ENCOUNTER — Other Ambulatory Visit (HOSPITAL_COMMUNITY)
Admission: RE | Admit: 2017-05-20 | Discharge: 2017-05-20 | Disposition: A | Discharge status: Home / Self Care | Payer: 59 | Source: Ambulatory Visit | Attending: Family Medicine | Admitting: Family Medicine

## 2017-05-20 ENCOUNTER — Ambulatory Visit (INDEPENDENT_AMBULATORY_CARE_PROVIDER_SITE_OTHER): Payer: 59 | Admitting: Family Medicine

## 2017-05-20 ENCOUNTER — Encounter: Payer: Self-pay | Admitting: *Deleted

## 2017-05-20 VITALS — BP 128/84 | HR 74 | Wt 116.7 lb

## 2017-05-20 DIAGNOSIS — R87810 Cervical high risk human papillomavirus (HPV) DNA test positive: Secondary | ICD-10-CM | POA: Insufficient documentation

## 2017-05-20 DIAGNOSIS — R87610 Atypical squamous cells of undetermined significance on cytologic smear of cervix (ASC-US): Secondary | ICD-10-CM

## 2017-05-20 LAB — POCT PREGNANCY, URINE: Preg Test, Ur: NEGATIVE

## 2017-05-20 NOTE — Progress Notes (Signed)
Patient Name: Beverly Stewart, female   DOB: [birth year], 32 y.o.  MRN: [id number]  Colposcopy Procedure Note:  G1P0010 Pregnancy status: Unknown Indications: ASCUS, +HR HPV HPV:  Positive Cervical History:  Previous Abnormal Pap: LSIL 2018  Previous Colposcopy: 07/30/16 - CIN 1, neg ECC  Previous LEEP or Cryo: no  Smoking: Former Smoker Hysterectomy: No   Patient given informed consent, signed copy in the chart, time out was performed.    Exam: Vulva and Vagina grossly normal.  Cervix viewed with speculum and colposcope after application of acetic acid:  Cervix Fully Visualized Squamocolumnar Junction Visibility: Fully visualized  Acetowhite lesions: 6 and 12 o'clock Other Lesions: None Punctation: Fine  Mosaicism: Not present Abnormal vasculature: No  Biopsies: 6 and 12 o'clock ECC: Yes - Curette and Brush  Hemostasis achieved with:  Silver Nitrate  Colposcopy Impression:  CIN1   Patient was given post procedure instructions.  Will call patient with results.

## 2017-05-20 NOTE — Addendum Note (Signed)
Addended by: Levie HeritageSTINSON, Kealohilani Maiorino J on: 05/20/2017 03:44 PM   Modules accepted: Orders

## 2017-05-24 ENCOUNTER — Encounter: Payer: Self-pay | Admitting: Family Medicine

## 2018-06-03 ENCOUNTER — Telehealth: Payer: Self-pay | Admitting: Student

## 2018-06-03 NOTE — Telephone Encounter (Signed)
The patient called to schedule an annual appointment. Sending a reminder letter.

## 2018-06-23 ENCOUNTER — Ambulatory Visit: Payer: 59 | Admitting: Family Medicine

## 2018-08-21 ENCOUNTER — Telehealth: Payer: Self-pay | Admitting: Obstetrics and Gynecology

## 2018-08-21 NOTE — Telephone Encounter (Signed)
Called the patient to inform of upcoming appointment. The line answered and hung up. Mailing a appointment reminder.

## 2018-08-23 ENCOUNTER — Inpatient Hospital Stay (HOSPITAL_COMMUNITY)
Admission: AD | Admit: 2018-08-23 | Discharge: 2018-08-23 | Disposition: A | Discharge status: Home / Self Care | Payer: 59 | Attending: Obstetrics and Gynecology | Admitting: Obstetrics and Gynecology

## 2018-08-23 ENCOUNTER — Other Ambulatory Visit: Payer: Self-pay

## 2018-08-23 DIAGNOSIS — Z3202 Encounter for pregnancy test, result negative: Secondary | ICD-10-CM

## 2018-08-23 DIAGNOSIS — N939 Abnormal uterine and vaginal bleeding, unspecified: Secondary | ICD-10-CM | POA: Diagnosis not present

## 2018-08-23 LAB — POCT PREGNANCY, URINE: Preg Test, Ur: NEGATIVE

## 2018-08-23 NOTE — MAU Provider Note (Signed)
First Provider Initiated Contact with Patient 08/23/18 0945      S Ms. Fritzi Scripter is a 33 y.o. G1P0010 non-pregnant female who presents to [facility]U today with complaint of vaginal bleeding. States she hasn't "felt right" since having intercourse a few weeks ago. Started bleeding like a period this morning but states her period isn't supposed to start until 6/16. Had a negative pregnancy test yesterday.    O BP 125/67 (BP Location: Right Arm)   Pulse 88   Temp 98.8 F (37.1 C) (Oral)   Resp 18   LMP 07/29/2018 (Approximate)   SpO2 100%  Physical Exam  Nursing note and vitals reviewed. Constitutional: She appears well-developed and well-nourished. No distress.  HENT:  Head: Normocephalic and atraumatic.  Respiratory: Effort normal. No respiratory distress.  Skin: She is not diaphoretic.  Psychiatric: She has a normal mood and affect. Her behavior is normal. Judgment and thought content normal.    A Non pregnant female Medical screening exam complete 1. Negative pregnancy test   2. Vaginal bleeding      P Discharge from [facility]U in stable condition Patient given the option of transfer to [facility] for further evaluation or seek care in outpatient facility of choice List of options for follow-up given  Warning signs for worsening condition that would warrant emergency follow-up discussed Patient may return to MAU as needed for pregnancy related complaints  Jorje Guild, NP 08/23/2018 9:45 AM

## 2018-08-23 NOTE — MAU Note (Signed)
Beverly Stewart is a 33 y.o. here in MAU reporting: negative pregnancy test a few days ago. Been having cramping since last night and bleeding since this AM. Unsure if this is her period, states it would be early if it was her period.   LMP: 07/29/18 approximately  Onset of complaint: last night  Pain score: 3/10  Vitals:   08/23/18 0941  BP: 125/67  Pulse: 88  Resp: 18  Temp: 98.8 F (37.1 C)  SpO2: 100%     Lab orders placed from triage: UPT

## 2018-08-25 ENCOUNTER — Telehealth: Payer: Self-pay | Admitting: Emergency Medicine

## 2018-08-25 NOTE — Telephone Encounter (Signed)
Pt called and left a message on the nurse voicemail line stating that she has an annual appointment scheduled for 6/29 and she wants the appointment cancelled.

## 2018-09-15 ENCOUNTER — Ambulatory Visit: Payer: 59 | Admitting: Family Medicine

## 2018-09-25 ENCOUNTER — Telehealth: Payer: Self-pay | Admitting: Family Medicine

## 2018-09-25 NOTE — Telephone Encounter (Signed)
Attempted to call patient about her appointment on 7/10 @ 9:35. Per Jeanett Schlein flipped the patient's appointment time with another provider due to some scheduling concerns. Patient was scheduled for 7/10 @ 8:35. No answer, left voicemail with new appointment time. Patient instructed to wear a face mask and no visitors are allowed with her. Patient instructed to not attend the appointment if she has any symptoms. Symptom list left

## 2018-09-26 ENCOUNTER — Ambulatory Visit: Payer: 59 | Admitting: Family Medicine

## 2018-09-26 ENCOUNTER — Ambulatory Visit: Payer: 59 | Admitting: Women's Health

## 2018-10-02 ENCOUNTER — Inpatient Hospital Stay (HOSPITAL_COMMUNITY): Payer: 59

## 2018-10-02 ENCOUNTER — Other Ambulatory Visit: Payer: Self-pay

## 2018-10-02 ENCOUNTER — Telehealth: Payer: Self-pay | Admitting: Family Medicine

## 2018-10-02 ENCOUNTER — Encounter (HOSPITAL_COMMUNITY): Payer: Self-pay | Admitting: *Deleted

## 2018-10-02 ENCOUNTER — Inpatient Hospital Stay (HOSPITAL_COMMUNITY)
Admission: AD | Admit: 2018-10-02 | Discharge: 2018-10-02 | Disposition: A | Discharge status: Home / Self Care | Payer: 59 | Attending: Obstetrics and Gynecology | Admitting: Obstetrics and Gynecology

## 2018-10-02 DIAGNOSIS — Z833 Family history of diabetes mellitus: Secondary | ICD-10-CM | POA: Diagnosis not present

## 2018-10-02 DIAGNOSIS — Z3A01 Less than 8 weeks gestation of pregnancy: Secondary | ICD-10-CM | POA: Insufficient documentation

## 2018-10-02 DIAGNOSIS — Z6740 Type O blood, Rh positive: Secondary | ICD-10-CM | POA: Insufficient documentation

## 2018-10-02 DIAGNOSIS — O4690 Antepartum hemorrhage, unspecified, unspecified trimester: Secondary | ICD-10-CM

## 2018-10-02 DIAGNOSIS — O4691 Antepartum hemorrhage, unspecified, first trimester: Secondary | ICD-10-CM | POA: Diagnosis not present

## 2018-10-02 DIAGNOSIS — Z800 Family history of malignant neoplasm of digestive organs: Secondary | ICD-10-CM | POA: Diagnosis not present

## 2018-10-02 DIAGNOSIS — Z803 Family history of malignant neoplasm of breast: Secondary | ICD-10-CM | POA: Diagnosis not present

## 2018-10-02 DIAGNOSIS — O26891 Other specified pregnancy related conditions, first trimester: Secondary | ICD-10-CM | POA: Diagnosis not present

## 2018-10-02 DIAGNOSIS — Z87891 Personal history of nicotine dependence: Secondary | ICD-10-CM | POA: Diagnosis not present

## 2018-10-02 DIAGNOSIS — O209 Hemorrhage in early pregnancy, unspecified: Secondary | ICD-10-CM | POA: Insufficient documentation

## 2018-10-02 DIAGNOSIS — Z8279 Family history of other congenital malformations, deformations and chromosomal abnormalities: Secondary | ICD-10-CM | POA: Diagnosis not present

## 2018-10-02 HISTORY — DX: Unspecified infectious disease: B99.9

## 2018-10-02 HISTORY — DX: Unspecified abnormal cytological findings in specimens from vagina: R87.629

## 2018-10-02 LAB — CBC
HCT: 43 % (ref 36.0–46.0)
Hemoglobin: 13.9 g/dL (ref 12.0–15.0)
MCH: 30.2 pg (ref 26.0–34.0)
MCHC: 32.3 g/dL (ref 30.0–36.0)
MCV: 93.3 fL (ref 80.0–100.0)
Platelets: 215 10*3/uL (ref 150–400)
RBC: 4.61 MIL/uL (ref 3.87–5.11)
RDW: 12.4 % (ref 11.5–15.5)
WBC: 6.2 10*3/uL (ref 4.0–10.5)
nRBC: 0 % (ref 0.0–0.2)

## 2018-10-02 LAB — ABO/RH: ABO/RH(D): O POS

## 2018-10-02 LAB — HCG, QUANTITATIVE, PREGNANCY: hCG, Beta Chain, Quant, S: 121 m[IU]/mL — ABNORMAL HIGH (ref <5)

## 2018-10-02 MED ORDER — ACETAMINOPHEN 325 MG PO TABS
650.0000 mg | ORAL_TABLET | Freq: Once | ORAL | Status: AC
  Administered 2018-10-02: 650 mg via ORAL
  Filled 2018-10-02: qty 2

## 2018-10-02 NOTE — Telephone Encounter (Signed)
Patient came into the office stating that she is currently pregnant and bleeding heavily. Patient instructed that she needs to go to MAU to be evaluated. Address give and patient verbalized understanding.

## 2018-10-02 NOTE — Discharge Instructions (Signed)
Human Chorionic Gonadotropin Test °Why am I having this test? °A human chorionic gonadotropin (hCG) test is done to determine whether you are pregnant. It can also be used: °· To diagnose an abnormal pregnancy. °· To determine whether you have had a failed pregnancy (miscarriage) or are at risk of one. °What is being tested? °This test checks the level of the human chorionic gonadotropin (hCG) hormone in the blood. This hormone is produced during pregnancy by the cells that form the placenta. The placenta is the organ that grows inside your womb (uterus) to nourish a developing baby. When you are pregnant, hCG can be detected in your blood or urine 7 to 8 days before your missed period. It continues to go up for the first 8-10 weeks of pregnancy. °The presence of hCG in your blood can be measured with several different types of tests. You may have: °· A urine test. °? Because this hormone is eliminated from your body by your kidneys, you may have a urine test to find out whether you are pregnant. A home pregnancy test detects whether there is hCG in your urine. °? A urine test only shows whether there is hCG in your urine. It does not measure how much. °· A qualitative blood test. °? You may have this type of blood test to find out if you are pregnant. °? This blood test only shows whether there is hCG in your blood. It does not measure how much. °· A quantitative blood test. °? This type of blood test measures the amount of hCG in your blood. °? You may have this test to: °§ Diagnose an abnormal pregnancy. °§ Check whether you have had a miscarriage. °§ Determine whether you are at risk of a miscarriage. °What kind of sample is taken? ° °  ° °Two kinds of samples may be collected to test for the hCG hormone. °· Blood. It is usually collected by inserting a needle into a blood vessel. °· Urine. It is usually collected by urinating into a germ-free (sterile) specimen cup. It is best to collect the sample the first  time you urinate in the morning. °How do I prepare for this test? °No preparation is needed for a blood test.  °For the urine test: °· Let your health care provider know about: °? All medicines you are taking, including vitamins, herbs, creams, and over-the-counter medicines. °? Any blood in your urine. This may interfere with the result. °· Do not drink too much fluid. Drink as you normally would, or as directed by your health care provider. °How are the results reported? °Depending on the type of test that you have, your test results may be reported as values. Your health care provider will compare your results to normal ranges that were established after testing a large group of people (reference ranges). Reference ranges may vary among labs and hospitals. For this test, common reference ranges that show absence of pregnancy are: °· Quantitative hCG blood levels: less than 5 IU/L. °Other results will be reported as either positive or negative. For this test, normal results (meaning the absence of pregnancy) are: °· Negative for hCG in the urine test. °· Negative for hCG in the qualitative blood test. °What do the results mean? °Urine and qualitative blood test °· A negative result could mean: °? That you are not pregnant. °? That the test was done too early in your pregnancy to detect hCG in your blood or urine. If you still have other signs   of pregnancy, the test will be repeated. °· A positive result means: °? That you are most likely pregnant. Your health care provider may confirm your pregnancy with an imaging study (ultrasound) of your uterus, if needed. °Quantitative blood test °Results of the quantitative hCG blood test will be interpreted as follows: °· Less than 5 IU/L: You are most likely not pregnant. °· Greater than 25 IU/L: You are most likely pregnant. °· hCG levels that are higher than expected: °? You are pregnant with twins. °? You have abnormal growths in the uterus. °· hCG levels that are  rising more slowly than expected: °? You have an ectopic pregnancy (also called a tubal pregnancy). °· hCG levels that are falling: °? You may be having a miscarriage. °Talk with your health care provider about what your results mean. °Questions to ask your health care provider °Ask your health care provider, or the department that is doing the test: °· When will my results be ready? °· How will I get my results? °· What are my treatment options? °· What other tests do I need? °· What are my next steps? °Summary °· A human chorionic gonadotropin test is done to determine whether you are pregnant. °· When you are pregnant, hCG can be detected in your blood or urine 7 to 8 days before your missed period. It continues to go up for the first 8-10 weeks of pregnancy. °· Your hCG level can be measured with different types of tests. You may have a urine test, a qualitative blood test, or a quantitative blood test. °· Talk with your health care provider about what your results mean. °This information is not intended to replace advice given to you by your health care provider. Make sure you discuss any questions you have with your health care provider. °Document Released: 04/06/2004 Document Revised: 02/04/2017 Document Reviewed: 02/04/2017 °Elsevier Patient Education © 2020 [employer]vier Inc. ° °

## 2018-10-02 NOTE — MAU Note (Signed)
Has paperwork from [facility] with her, +test on 7/13

## 2018-10-02 NOTE — MAU Note (Signed)
Pt reports she [facility] a positive pregnancy [facility]. Started having heavy vaginal bleeding last night with mild to moderate cramping. Still bleeding this morning thought she saw small amount of something come out.

## 2018-10-02 NOTE — MAU Provider Note (Signed)
History     CSN: [id number]  Arrival date and time: 10/02/18 1048   None     Chief Complaint  Patient presents with  . Vaginal Bleeding   HPI Beverly Stewart is a 33 y.o. G1P0010 at Unknown [place] who presents to MAU with chief complaint of heavy vaginal bleeding in the setting of a positive pregnancy test Monday 09/29/18 at [facility]. She reports an LMP of 07/29/18 but startes bleeding 08/23/18 and had a negative pregnancy test in MAU. Her bleeding resolved soon after discharge. She started bleeding again last night and experienced new onset suprapubic cramping. Upon arrival in MAU this morning she reports ongoing light bleeding with small clots as well as bilateral lower abdominal "cramps" and tenderness which she rates as 5-8/10. She took 200mg  Ibuprofen around 0530 today but did not experience relief. She denies weakness, syncope, dizziness, SOB, dysuria, abdominal tenderness, fever or recent illness.  OB History    Gravida  1   Para      Term      Preterm      AB  1   Living        SAB      TAB  1   Ectopic      Multiple      Live Births              Past Medical History:  Diagnosis Date  . Allergy   . Infection    UTI  . Seasonal allergies   . Vaginal Pap smear, abnormal    colpo, ok since    Past Surgical History:  Procedure Laterality Date  . NO PAST SURGERIES      Family History  Problem Relation Age of Onset  . Diabetes Father   . Hypertension Maternal Grandmother   . Diabetes Maternal Grandmother   . Kidney failure Maternal Grandmother        dialysis  . Asthma Maternal Grandmother   . Kidney disease Maternal Grandmother   . Stroke Maternal Grandfather   . Colon cancer Maternal Grandfather        mets  . Birth defects Maternal Grandfather   . Hypertension Mother   . Seizures Mother   . Breast cancer Other     Social History   Tobacco Use  . Smoking status: Former Smoker    Types: Cigarettes    Quit date: 04/27/2015    Years since  quitting: 3.4  . Smokeless tobacco: Never Used  . Tobacco comment: only tried for a wk  Substance Use Topics  . Alcohol use: Not Currently    Comment: ocassionally  . Drug use: No    Allergies: No Known Allergies  Medications Prior to Admission  Medication Sig Dispense Refill Last Dose  . Multiple Vitamin (MULTIVITAMIN) tablet Take 1 tablet by mouth daily.   Past Month at Unknown time    Review of Systems  Constitutional: Negative for chills and fever.  Respiratory: Negative for shortness of breath.   Gastrointestinal: Positive for abdominal pain.  Genitourinary: Positive for vaginal bleeding.  Musculoskeletal: Negative for back pain.  Neurological: Negative for dizziness, syncope, weakness and light-headedness.  All other systems reviewed and are negative.  Physical Exam   Blood pressure (!) 125/53, pulse 82, temperature 98.6 F (37 C), resp. rate 18, height 5\' 6"  (1.676 m), weight 57.2 kg, last menstrual period 08/17/2018.  Physical Exam  Nursing note and vitals reviewed. Constitutional: She appears well-developed and well-nourished.  Cardiovascular: Normal rate.  Respiratory:  Effort normal.  GI: Soft. She exhibits no distension. There is abdominal tenderness. There is no rebound and no guarding.  Suprapubic tenderness to light palpation  Genitourinary:    Vaginal discharge present.     Genitourinary Comments: Moderate amount of dark red blood visible in vaginal vault. Scant dark red blood visible coming from cervical os.   Skin: Skin is warm and dry.  Psychiatric: She has a normal mood and affect. Her behavior is normal. Judgment and thought content normal.    MAU Course/MDM  Procedures: sterile speculum exam  Patient Vitals for the past 24 hrs:  BP Temp Pulse Resp Height Weight  10/02/18 1231 111/77 - 75 16 - -  10/02/18 1114 125/79 - 76 - - -  10/02/18 1058 (!) 125/53 98.6 F (37 C) 82 18 5\' 6"  (1.676 m) 57.2 kg   Results for orders placed or performed  during the hospital encounter of 10/02/18 (from the past 24 hour(s))  CBC     Status: None   Collection Time: 10/02/18 11:27 AM  Result Value Ref Range   WBC 6.2 4.0 - 10.5 K/uL   RBC 4.61 3.87 - 5.11 MIL/uL   Hemoglobin 13.9 12.0 - 15.0 g/dL   HCT 43.0 36.0 - 46.0 %   MCV 93.3 80.0 - 100.0 fL   MCH 30.2 26.0 - 34.0 pg   MCHC 32.3 30.0 - 36.0 g/dL   RDW 12.4 11.5 - 15.5 %   Platelets 215 150 - 400 K/uL   nRBC 0.0 0.0 - 0.2 %  hCG, quantitative, pregnancy     Status: Abnormal   Collection Time: 10/02/18 11:27 AM  Result Value Ref Range   hCG, Beta Chain, Quant, S 121 (H) <5 mIU/mL  ABO/Rh     Status: None   Collection Time: 10/02/18 11:27 AM  Result Value Ref Range   ABO/RH(D) O POS    No rh immune globuloin      NOT A RH IMMUNE GLOBULIN CANDIDATE, PT RH POSITIVE Performed at [facility], [place] [address] [place], [place] [address]    US Ob Less Than 14 Weeks With Ob Transvaginal  Result Date: 10/02/2018 CLINICAL DATA:  Vaginal bleeding in 1st trimester pregnancy. EXAM: OBSTETRIC <14 WK [place] AND TRANSVAGINAL OB US TECHNIQUE: Both transabdominal and transvaginal ultrasound examinations were performed for complete evaluation of the gestation as well as the maternal uterus, adnexal regions, and pelvic cul-de-sac. Transvaginal technique was performed to assess early pregnancy. COMPARISON:  None. FINDINGS: Intrauterine gestational sac: Single, with double decidual sac sign Yolk sac:  Not Visualized. Embryo:  Not Visualized. MSD: 3 mm   4 w   6 d Subchorionic hemorrhage:  None visualized. Maternal uterus/adnexae: Normal appearance of both ovaries. No mass or abnormal free fluid identified. IMPRESSION: Single early approximately 5 week intrauterine gestational sac. Suggest correlation with serial b-hCG levels, and consider followup ultrasound to assess viability in 14 days. No significant maternal uterine or adnexal abnormality identified. Electronically Signed   By: Marlaine Hind M.D.   On:  10/02/2018 12:33   Assessment and Plan  --33 y.o. G2P0010 at [redacted]w[redacted]d by LMP -- Vaginal bleeding --Blood type O POS --Quant hCG 121 --Discharge home in stable condition with bleeding precautions  F/U: Patient to return to MAU for repat quant Sunday morning 10/05/18 (pt unavailable 07/18)  Darlina Rumpf, CNM 10/02/2018, 1:54 PM

## 2018-10-02 NOTE — MAU Note (Signed)
Some relief, though still cramping.

## 2018-10-05 ENCOUNTER — Inpatient Hospital Stay (HOSPITAL_COMMUNITY)
Admission: AD | Admit: 2018-10-05 | Discharge: 2018-10-05 | Disposition: A | Discharge status: Home / Self Care | Payer: 59 | Attending: Family Medicine | Admitting: Family Medicine

## 2018-10-05 ENCOUNTER — Other Ambulatory Visit: Payer: Self-pay

## 2018-10-05 DIAGNOSIS — O039 Complete or unspecified spontaneous abortion without complication: Secondary | ICD-10-CM | POA: Insufficient documentation

## 2018-10-05 LAB — HCG, QUANTITATIVE, PREGNANCY: hCG, Beta Chain, Quant, S: 16 m[IU]/mL — ABNORMAL HIGH (ref <5)

## 2018-10-05 NOTE — MAU Note (Signed)
Beverly Stewart is a 33 y.o. at [redacted]w[redacted]d here in MAU reporting: here for follow up hcg. No pain or bleeding today.  Pain score: 0/10  Vitals:   10/05/18 0823  BP: 122/75  Pulse: 63  Resp: 16  Temp: 98.5 F (36.9 C)  SpO2: 100%      Lab orders placed from triage: hcg

## 2018-10-05 NOTE — MAU Provider Note (Signed)
[name]  is a 33 y.o. G2P0010  at [redacted]w[redacted]d who presents to MAU today for follow-up quant hCG. The patient denies abdominal pain, vaginal bleeding, N/V or fever.   BP 122/75 (BP Location: Right Arm)   Pulse 63   Temp 98.5 F (36.9 C) (Oral)   Resp 16   Ht 5\' 6"  (1.676 m)   Wt 57.1 kg   LMP 08/25/2018   SpO2 100%   BMI 20.30 kg/m   GENERAL: Well-developed, well-nourished female in no acute distress.  HEENT: Normocephalic, atraumatic.   LUNGS: Effort normal HEART: Regular rate  SKIN: Warm, dry and without erythema PSYCH: Normal mood and affect   A: 1. Miscarriage   -significant drop in HCG c/w miscarriage -RH positive  Component     Latest Ref Rng & Units 10/02/2018 10/05/2018  HCG, Beta Chain, Quant, S     <5 mIU/mL 121 (H) 16 (H)    P: Discharge home Discussed bleeding precautions msg to [place] for SAB f/u appointment  Jorje Guild, NP  10/05/2018 8:42 AM

## 2018-10-05 NOTE — MAU Note (Signed)
Robyne Askew NP states she has talked to the patient and the patient has left since her blood has been drawn. NP will call pt with results. Pt to remain on MAU board

## 2018-10-05 NOTE — Discharge Instructions (Signed)
Miscarriage °A miscarriage is the loss of an unborn baby (fetus) before the 20th week of pregnancy. Most miscarriages happen during the first 3 months of pregnancy. Sometimes, a miscarriage can happen before a woman knows that she is pregnant. °Having a miscarriage can be an emotional experience. If you have had a miscarriage, talk with your health care provider about any questions you may have about miscarrying, the grieving process, and your plans for future pregnancy. °What are the causes? °A miscarriage may be caused by: °· Problems with the genes or chromosomes of the fetus. These problems make it impossible for the baby to develop normally. They are often the result of random errors that occur early in the development of the baby, and are not passed from parent to child (not inherited). °· Infection of the cervix or uterus. °· Conditions that affect hormone balance in the body. °· Problems with the cervix, such as the cervix opening and thinning before pregnancy is at term (cervical insufficiency). °· Problems with the uterus. These may include: °? A uterus with an abnormal shape. °? Fibroids in the uterus. °? Congenital abnormalities. These are problems that were present at birth. °· Certain medical conditions. °· Smoking, drinking alcohol, or using drugs. °· Injury (trauma). °In many cases, the cause of a miscarriage is not known. °What are the signs or symptoms? °Symptoms of this condition include: °· Vaginal bleeding or spotting, with or without cramps or pain. °· Pain or cramping in the abdomen or lower back. °· Passing fluid, tissue, or blood clots from the vagina. °How is this diagnosed? °This condition may be diagnosed based on: °· A physical exam. °· Ultrasound. °· Blood tests. °· Urine tests. °How is this treated? °Treatment for a miscarriage is sometimes not necessary if you naturally pass all the tissue that was in your uterus. If necessary, this condition may be treated with: °· Dilation and  curettage (D&C). This is a procedure in which the cervix is stretched open and the lining of the uterus (endometrium) is scraped. This is done only if tissue from the fetus or placenta remains in the body (incomplete miscarriage). °· Medicines, such as: °? Antibiotic medicine, to treat infection. °? Medicine to help the body pass any remaining tissue. °? Medicine to reduce (contract) the size of the uterus. These medicines may be given if you have a lot of bleeding. °If you have Rh negative blood and your baby was Rh positive, you will need a shot of a medicine called Rh immunoglobulinto protect your future babies from Rh blood problems. "Rh-negative" and "Rh-positive" refer to whether or not the blood has a specific protein found on the surface of red blood cells (Rh factor). °Follow these instructions at home: °Medicines ° °· Take over-the-counter and prescription medicines only as told by your health care provider. °· If you were prescribed antibiotic medicine, take it as told by your health care provider. Do not stop taking the antibiotic even if you start to feel better. °· Do not take NSAIDs, such as aspirin and ibuprofen, unless they are approved by your health care provider. These medicines can cause bleeding. °Activity °· Rest as directed. Ask your health care provider what activities are safe for you. °· Have someone help with home and family responsibilities during this time. °General instructions °· Keep track of the number of sanitary pads you use each day and how soaked (saturated) they are. Write down this information. °· Monitor the amount of tissue or blood clots that   you pass from your vagina. Save any large amounts of tissue for your health care provider to examine. °· Do not use tampons, douche, or have sex until your health care provider approves. °· To help you and your partner with the process of grieving, talk with your health care provider or seek counseling. °· When you are ready, meet with  your health care provider to discuss any important steps you should take for your health. Also, discuss steps you should take to have a healthy pregnancy in the future. °· Keep all follow-up visits as told by your health care provider. This is important. °Where to find more information °· The American Congress of Obstetricians and Gynecologists: www.acog.org °· U.S. Department of Health and Human Services Office of Women’s Health: www.womenshealth.gov °Contact a health care provider if: °· You have a fever or chills. °· You have a foul smelling vaginal discharge. °· You have more bleeding instead of less. °Get help right away if: °· You have severe cramps or pain in your back or abdomen. °· You pass blood clots or tissue from your vagina that is walnut-sized or larger. °· You soak more than 1 regular sanitary pad in an hour. °· You become light-headed or weak. °· You pass out. °· You have feelings of sadness that take over your thoughts, or you have thoughts of hurting yourself. °Summary °· Most miscarriages happen in the first 3 months of pregnancy. Sometimes miscarriage happens before a woman even knows that she is pregnant. °· Follow your health care provider's instruction for home care. Keep all follow-up appointments. °· To help you and your partner with the process of grieving, talk with your health care provider or seek counseling. °This information is not intended to replace advice given to you by your health care provider. Make sure you discuss any questions you have with your health care provider. °Document Released: 08/29/2000 Document Revised: 06/27/2018 Document Reviewed: 04/10/2016 °Elsevier Patient Education © 2020 [employer] Inc. ° °

## 2018-10-05 NOTE — MAU Note (Signed)
Pt discharge by NP over the phone. Pt denied pain upon arrival to the unit.

## 2018-10-06 ENCOUNTER — Telehealth: Payer: Self-pay | Admitting: Family Medicine

## 2018-10-06 NOTE — Telephone Encounter (Signed)
Called and left a detailed message about upcoming appt, also to wear a face mask and no visitors are allowed due to [place]

## 2018-10-07 ENCOUNTER — Encounter: Payer: Self-pay | Admitting: Medical

## 2018-10-07 ENCOUNTER — Ambulatory Visit (INDEPENDENT_AMBULATORY_CARE_PROVIDER_SITE_OTHER): Payer: 59 | Admitting: Medical

## 2018-10-07 ENCOUNTER — Other Ambulatory Visit: Payer: Self-pay

## 2018-10-07 VITALS — BP 112/67 | HR 72 | Temp 98.9°F | Ht 66.0 in | Wt 123.9 lb

## 2018-10-07 DIAGNOSIS — O039 Complete or unspecified spontaneous abortion without complication: Secondary | ICD-10-CM

## 2018-10-07 DIAGNOSIS — Z01419 Encounter for gynecological examination (general) (routine) without abnormal findings: Secondary | ICD-10-CM | POA: Diagnosis not present

## 2018-10-07 DIAGNOSIS — Z1151 Encounter for screening for human papillomavirus (HPV): Secondary | ICD-10-CM | POA: Diagnosis not present

## 2018-10-07 DIAGNOSIS — Z113 Encounter for screening for infections with a predominantly sexual mode of transmission: Secondary | ICD-10-CM | POA: Diagnosis not present

## 2018-10-07 DIAGNOSIS — Z124 Encounter for screening for malignant neoplasm of cervix: Secondary | ICD-10-CM | POA: Diagnosis not present

## 2018-10-07 NOTE — Patient Instructions (Signed)

## 2018-10-07 NOTE — Progress Notes (Signed)
Abnormal pap last year colpo CIN 1   Recent SAB  Subjective:    Beverly Stewart is a 33 y.o. female who presents for an annual exam. The patient also just had a miscarriage diagnosed in MAU 2 days ago. She states only scant bleeding and denies pain. She is not currently interested in birth control and may desire another attempt at pregnancy soon. The patient is not currently sexually active. GYN screening history: last pap: approximate date 05/2017 and was abnormal: ASCUS +HPV. 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 The patient wears seatbelts: yes. The patient participates in regular exercise: not asked. Has the patient ever been transfused or tattooed?: not asked. The patient reports that there is not domestic violence in her life.   Menstrual History: OB History    Gravida  2   Para      Term      Preterm      AB  1   Living        SAB      TAB  1   Ectopic      Multiple      Live Births              Patient's last menstrual period was 08/23/2018 (approximate).    The following portions of the patient's history were reviewed and updated as appropriate: allergies, current medications, past family history, past medical history, past social history, past surgical history and problem list.  Review of Systems Pertinent items are noted in HPI.    Objective:     Physical Exam  Nursing note and vitals reviewed. Constitutional: She is oriented to person, place, and time. She appears well-developed and well-nourished. No distress.  HENT:  Head: Normocephalic and atraumatic.  Eyes: EOM are normal.  Neck: Normal range of motion. Neck supple. No thyromegaly present.  Cardiovascular: Normal rate, regular rhythm and normal heart sounds.  No murmur heard. Respiratory: Effort normal and breath sounds normal. No respiratory distress. She has no wheezes.  GI: Soft. Bowel sounds are normal. She exhibits no distension and no mass. There is abdominal tenderness (mild diffuse). There is no  rebound and no guarding.  Genitourinary: Uterus is not enlarged and not tender. Cervix exhibits no motion tenderness, no discharge and no friability. Right adnexum displays no mass and no tenderness. Left adnexum displays no mass and no tenderness.    Vaginal bleeding (scant brown) present.     No vaginal discharge.  There is bleeding (scant brown) in the vagina.  Neurological: She is alert and oriented to person, place, and time.  Skin: Skin is warm and dry. No erythema.  Psychiatric: She has a normal mood and affect.   .    Assessment:    Healthy female exam.   History of abnormal pap smear Recent SAB    Plan:     Await pap smear results.   Patient to return for hCG draw in 1-2 weeks  Advised of need for condom use until ready to conceive again, but at least until  Negative hCG Bleeding precautions discussed   Luvenia Redden, PA-C 10/07/2018 11:39 AM

## 2018-10-10 LAB — CYTOLOGY - PAP
Chlamydia: NEGATIVE
HPV 16/18/45 genotyping: NEGATIVE
HPV: DETECTED — AB
Neisseria Gonorrhea: NEGATIVE

## 2018-10-13 ENCOUNTER — Telehealth: Payer: Self-pay | Admitting: General Practice

## 2018-10-13 ENCOUNTER — Other Ambulatory Visit: Payer: Self-pay | Admitting: *Deleted

## 2018-10-13 DIAGNOSIS — O039 Complete or unspecified spontaneous abortion without complication: Secondary | ICD-10-CM

## 2018-10-13 NOTE — Telephone Encounter (Signed)
Called patient, no answer- left message to call us back for results. Will send mychart message 

## 2018-10-13 NOTE — Telephone Encounter (Signed)
-----   Message from Luvenia Redden, PA-C sent at 10/13/2018  2:16 PM EDT ----- Pap shows LSIL +HPV. Per ASCCP guidelines, patient needs Colpo. Please make appointment and inform patient of appointment and results.   Luvenia Redden, PA-C 10/13/2018 2:16 PM

## 2018-10-14 ENCOUNTER — Other Ambulatory Visit: Payer: 59

## 2018-11-07 ENCOUNTER — Ambulatory Visit: Payer: 59 | Admitting: Medical

## 2018-11-20 ENCOUNTER — Telehealth: Payer: Self-pay | Admitting: Medical

## 2018-11-20 NOTE — Telephone Encounter (Signed)
Attempted to contact patient about her appointment on 9/4 @ 9:15. Patient instructed that the appointment is a mychart visit. Patient instructed to download the mychart app if not already done so. Patient instructed to give the office a call with any concerns. °

## 2018-11-21 ENCOUNTER — Other Ambulatory Visit: Payer: Self-pay

## 2018-11-21 ENCOUNTER — Encounter: Payer: 59 | Admitting: Medical

## 2018-11-21 ENCOUNTER — Telehealth: Payer: Self-pay | Admitting: Family Medicine

## 2018-11-21 ENCOUNTER — Encounter: Payer: Self-pay | Admitting: Medical

## 2018-11-21 ENCOUNTER — Encounter: Payer: Self-pay | Admitting: Family Medicine

## 2018-11-21 NOTE — Patient Instructions (Signed)
Please reschedule your missed appointment  

## 2018-11-21 NOTE — Telephone Encounter (Signed)
Called the patient in regards to the missed appointment. Left a voicemail informing the patient to call our office to reschedule. Also mailing the patient a missed appointment letter.

## 2018-11-21 NOTE — Progress Notes (Signed)
@  920 No answer @938am  no answer LVM to call office for appointment

## 2018-12-01 ENCOUNTER — Ambulatory Visit: Payer: 59 | Admitting: Obstetrics and Gynecology

## 2018-12-01 ENCOUNTER — Other Ambulatory Visit: Payer: Self-pay

## 2018-12-04 NOTE — Progress Notes (Signed)
Patient unable to void for UPT. colpo to be rescheduled

## 2018-12-25 ENCOUNTER — Ambulatory Visit: Payer: 59 | Admitting: Obstetrics and Gynecology

## 2019-03-16 ENCOUNTER — Telehealth: Payer: Self-pay | Admitting: Gastroenterology

## 2019-03-16 ENCOUNTER — Other Ambulatory Visit: Payer: Self-pay | Admitting: *Deleted

## 2019-03-16 DIAGNOSIS — R197 Diarrhea, unspecified: Secondary | ICD-10-CM

## 2019-03-16 MED ORDER — SUCRALFATE 1 G PO TABS: 1.0000 g | ORAL_TABLET | Freq: Three times a day (TID) | ORAL | 0 refills | Status: DC

## 2019-03-16 MED ORDER — DICYCLOMINE HCL 20 MG PO TABS: 20.0000 mg | ORAL_TABLET | Freq: Three times a day (TID) | ORAL | 0 refills | Status: DC | PRN

## 2019-03-16 NOTE — Telephone Encounter (Signed)
Pt said she is having inflammation, diarrhea, changes in BM's and have tried mylanta and it is not working. She said she is currently having a flareup.She is requesting a repeat colonoscopy

## 2019-03-16 NOTE — Telephone Encounter (Signed)
All orders placed in epic. Patient aware of labs and stated she would be in on Thursday. Scripts sent to pharmacy on file. Patient notified.

## 2019-03-16 NOTE — Telephone Encounter (Signed)
Ask her to submit the following labs while we wait for appointment dx diarrhea  1) CBC 2) CRP 3) ESR 4) CMET  5) Stool lactoferrin (WBC)   Dicyclomine 20 mg tid prn # 30 no refill carafate 1 g tidac/hs # 60 no refill

## 2019-03-16 NOTE — Telephone Encounter (Signed)
Beverly Stewart patient, DOD PM Dr. Carlean Purl:  Spoke to the patient who reports "It feels like someone lit a box of matches and threw them in my stomach." She describes these episodes as "flare ups." She reports she will have no symptoms for a couple of months then suddenly the burning and diarrhea begins and last for 4-6 weeks or so. During these "flare ups" she describes intense burning accompanied by diarrhea (1-3 times daily), watery to soft, light brown to yellow in color oftentimes with noticeable undigested food. She is taking Mylanta (no relief), IBGard, Probiotics, among a host of OTC GI medication with no relief. The patient has not been seen since 2018 so she has been scheduled with JL on 04/02/19 at 2:30 pm. She inquired about a prescribed medication she could take until she has be appt in January. The patient is currently not pregnant. Please advise.

## 2019-03-18 ENCOUNTER — Other Ambulatory Visit (INDEPENDENT_AMBULATORY_CARE_PROVIDER_SITE_OTHER): Payer: 59

## 2019-03-18 DIAGNOSIS — R197 Diarrhea, unspecified: Secondary | ICD-10-CM | POA: Diagnosis not present

## 2019-03-18 LAB — COMPREHENSIVE METABOLIC PANEL
ALT: 6 U/L (ref 0–35)
AST: 12 U/L (ref 0–37)
Albumin: 4.4 g/dL (ref 3.5–5.2)
Alkaline Phosphatase: 45 U/L (ref 39–117)
BUN: 14 mg/dL (ref 6–23)
CO2: 26 mEq/L (ref 19–32)
Calcium: 9.5 mg/dL (ref 8.4–10.5)
Chloride: 105 mEq/L (ref 96–112)
Creatinine, Ser: 0.82 mg/dL (ref 0.40–1.20)
GFR: 97.83 mL/min (ref >60.00)
Glucose, Bld: 89 mg/dL (ref 70–99)
Potassium: 3.6 mEq/L (ref 3.5–5.1)
Sodium: 138 mEq/L (ref 135–145)
Total Bilirubin: 1 mg/dL (ref 0.2–1.2)
Total Protein: 7.2 g/dL (ref 6.0–8.3)

## 2019-03-18 LAB — CBC WITH DIFFERENTIAL/PLATELET
Basophils Absolute: 0 10*3/uL (ref 0.0–0.1)
Basophils Relative: 0.9 % (ref 0.0–3.0)
Eosinophils Absolute: 0.2 10*3/uL (ref 0.0–0.7)
Eosinophils Relative: 4.6 % (ref 0.0–5.0)
HCT: 42.5 % (ref 36.0–46.0)
Hemoglobin: 14 g/dL (ref 12.0–15.0)
Lymphocytes Relative: 36.6 % (ref 12.0–46.0)
Lymphs Abs: 1.9 10*3/uL (ref 0.7–4.0)
MCHC: 32.9 g/dL (ref 30.0–36.0)
MCV: 92 fl (ref 78.0–100.0)
Monocytes Absolute: 0.4 10*3/uL (ref 0.1–1.0)
Monocytes Relative: 7 % (ref 3.0–12.0)
Neutro Abs: 2.6 10*3/uL (ref 1.4–7.7)
Neutrophils Relative %: 50.9 % (ref 43.0–77.0)
Platelets: 228 10*3/uL (ref 150.0–400.0)
RBC: 4.62 Mil/uL (ref 3.87–5.11)
RDW: 12.6 % (ref 11.5–15.5)
WBC: 5.1 10*3/uL (ref 4.0–10.5)

## 2019-03-18 LAB — SEDIMENTATION RATE: Sed Rate: 17 mm/hr (ref 0–20)

## 2019-03-18 LAB — HIGH SENSITIVITY CRP: CRP, High Sensitivity: 0.58 mg/L (ref 0.000–5.000)

## 2019-03-19 LAB — FECAL LACTOFERRIN, QUANT
Fecal Lactoferrin: POSITIVE — AB
MICRO NUMBER:: 1240962
SPECIMEN QUALITY:: ADEQUATE

## 2019-03-24 NOTE — Progress Notes (Signed)
Patient seeing JLL 1/14 - I sent Rx for dicyclomine Blood tests were all ok - she did hava a + lactoferrin Am asking RN to call for sx update also and let you guys know to decide any next steps

## 2019-04-02 ENCOUNTER — Ambulatory Visit (INDEPENDENT_AMBULATORY_CARE_PROVIDER_SITE_OTHER): Payer: 59 | Admitting: Physician Assistant

## 2019-04-02 ENCOUNTER — Encounter: Payer: Self-pay | Admitting: Physician Assistant

## 2019-04-02 VITALS — BP 142/80 | HR 82 | Temp 98.7°F | Ht 66.0 in | Wt 121.2 lb

## 2019-04-02 DIAGNOSIS — R197 Diarrhea, unspecified: Secondary | ICD-10-CM

## 2019-04-02 DIAGNOSIS — Z800 Family history of malignant neoplasm of digestive organs: Secondary | ICD-10-CM

## 2019-04-02 DIAGNOSIS — Z01818 Encounter for other preprocedural examination: Secondary | ICD-10-CM | POA: Diagnosis not present

## 2019-04-02 DIAGNOSIS — R1013 Epigastric pain: Secondary | ICD-10-CM | POA: Diagnosis not present

## 2019-04-02 MED ORDER — DICYCLOMINE HCL 20 MG PO TABS: 20.0000 mg | ORAL_TABLET | Freq: Four times a day (QID) | ORAL | 5 refills | Status: DC

## 2019-04-02 MED ORDER — OMEPRAZOLE 20 MG PO CPDR: 20.0000 mg | DELAYED_RELEASE_CAPSULE | Freq: Every day | ORAL | 5 refills | Status: DC

## 2019-04-02 MED ORDER — SUCRALFATE 1 G PO TABS: 1.0000 g | ORAL_TABLET | Freq: Three times a day (TID) | ORAL | 5 refills | Status: DC

## 2019-04-02 NOTE — Patient Instructions (Addendum)
If you are age 32 or older, your body mass index should be between 23-30. Your Body mass index is 19.57 kg/m. If this is out of the aforementioned range listed, please consider follow up with your Primary Care Provider.  If you are age 42 or younger, your body mass index should be between 19-25. Your Body mass index is 19.57 kg/m. If this is out of the aformentioned range listed, please consider follow up with your Primary Care Provider.   We have sent the following medications to your pharmacy for you to pick up at your convenience: Dicyclomine, Omeprazole, Carafate.  You have been scheduled for an endoscopy. Please follow written instructions given to you at your visit today. If you use inhalers (even only as needed), please bring them with you on the day of your procedure.

## 2019-04-02 NOTE — Progress Notes (Signed)
Chief Complaint: Abdominal pain and diarrhea  HPI:    Beverly Stewart is a 34 year old African-American female with a past medical history as listed below, known to Dr. Loletha Carrow, who presents to clinic today for follow-up of her abdominal pain and diarrhea.        04/26/2016 patient seen in clinic for abdominal pain with diarrhea.  She had labs including a CBC, CMP and celiac testing which were all normal.  She was given a trial of Hyoscyamine and recommended she have a colonoscopy.    06/29/2016 colonoscopy with diverticulosis in the right colon and otherwise normal.  She was given a trial of Librax twice daily.    03/16/2019 patient called and said she was having inflammation and tried Mylanta and nothing was working.  Described abdominal burning.  She is taking Mylanta, IBgard, probiotics and some over-the-counter GI medications with no relief.  Dr. Carlean Purl is Dr. The day and ordered CBC, CRP, ESR, CMP and stool lactoferrin.  She is also started on dicyclomine 20 mg 3 times daily as needed #30 and Carafate 1 g 3 times daily before meals and at bedtime.    03/18/2019 labs returned normal other than a positive lactoferrin.    Today, the patient tells me that she has continued to have "flareups" ever since being seen last in 2018.  Tells me that these flareups can last a long time and consist of a burning sensation that is typically b/l across her lower abdomen, under her umbilicus, as well as watery diarrhea multiple times a day.  Sometimes she can take Mylanta and eat cold ice and it helps and other times it does not.  This current flareup started at the end of November and is continuing.  Tells me that typically these flareups gradually get better and then the stool starts to form and it may stay that way for a couple of months.  Unable to identify any triggers.  Describes taking Librax 1 cap p.o. twice daily which did not help at all.  Recently she has been started on Carafate 3 times daily and Dicyclomine 3  times daily which has been helping.  In fact she is 70% better since starting these at the end of December.    Family history positive for stomach cancer in her grandmother.    Denies fever, chills, weight loss, anorexia, nausea, vomiting or symptoms that awaken her from sleep.      Past Medical History:  Diagnosis Date   Allergy    Infection    UTI   Seasonal allergies    Vaginal Pap smear, abnormal    colpo, ok since    Past Surgical History:  Procedure Laterality Date   NO PAST SURGERIES      Current Outpatient Medications  Medication Sig Dispense Refill   dicyclomine (BENTYL) 20 MG tablet Take 1 tablet (20 mg total) by mouth 4 (four) times daily. 120 tablet 5   Multiple Vitamin (MULTIVITAMIN) tablet Take 1 tablet by mouth daily.     sucralfate (CARAFATE) 1 g tablet Take 1 tablet (1 g total) by mouth 4 (four) times daily -  with meals and at bedtime. 60 tablet 5   omeprazole (PRILOSEC) 20 MG capsule Take 1 capsule (20 mg total) by mouth daily. 30 minutes before breakfast 30 capsule 5   No current facility-administered medications for this visit.    Allergies as of 04/02/2019   (No Known Allergies)    Family History  Problem Relation Age of Onset  Diabetes Father    Hypertension Maternal Grandmother    Diabetes Maternal Grandmother    Kidney failure Maternal Grandmother        dialysis   Asthma Maternal Grandmother    Kidney disease Maternal Grandmother    Stomach cancer Maternal Grandmother    Stroke Maternal Grandfather    Colon cancer Maternal Grandfather        mets   Birth defects Maternal Grandfather    Hypertension Mother    Seizures Mother    Breast cancer Other     Social History   Socioeconomic History   Marital status: Single    Spouse name: Not on file   Number of children: 0   Years of education: Not on file   Highest education level: Not on file  Occupational History   Occupation: [employer]  Tobacco Use   Smoking  status: Former Smoker    Types: Cigarettes    Quit date: 04/27/2015    Years since quitting: 3.9   Smokeless tobacco: Never Used   Tobacco comment: only tried for a wk  Substance and Sexual Activity   Alcohol use: Not Currently    Comment: ocassionally   Drug use: No   Sexual activity: Yes    Birth control/protection: None, Condom  Other Topics Concern   Not on file  Social History Narrative   Not on file   Social Determinants of Health   Financial Resource Strain:    Difficulty of Paying Living Expenses: Not on file  Food Insecurity:    Worried About Charity fundraiser in the Last Year: Not on file   [employer] of Food in the Last Year: Not on file  Transportation Needs:    Lack of Transportation (Medical): Not on file   Lack of Transportation (Non-Medical): Not on file  Physical Activity:    Days of Exercise per Week: Not on file   Minutes of Exercise per Session: Not on file  Stress:    Feeling of Stress : Not on file  Social Connections:    Frequency of Communication with Friends and Family: Not on file   Frequency of Social Gatherings with Friends and Family: Not on file   Attends Religious Services: Not on file   Active Member of Clubs or Organizations: Not on file   Attends Archivist Meetings: Not on file   Marital Status: Not on file  Intimate Partner Violence:    Fear of Current or Ex-Partner: Not on file   Emotionally Abused: Not on file   Physically Abused: Not on file   Sexually Abused: Not on file    Review of Systems:    Constitutional: No weight loss, fever or chills Cardiovascular: No chest pain Respiratory: No SOB Gastrointestinal: See HPI and otherwise negative   Physical Exam:  Vital signs: BP (!) 142/80 (BP Location: Left Arm, Patient Position: Sitting, Cuff Size: Normal)    Pulse 82    Temp 98.7 F (37.1 C)    Ht 5' 6"  (1.676 m)    Wt 121 lb 4 oz (55 kg)    LMP 08/25/2018    SpO2 96%    BMI 19.57 kg/m    Constitutional:   Pleasant AA female appears to be in NAD, Well developed, Well nourished, alert and cooperative Respiratory: Respirations even and unlabored. Lungs clear to auscultation bilaterally.   No wheezes, crackles, or rhonchi.  Cardiovascular: Normal S1, S2. No MRG. Regular rate and rhythm. No peripheral edema, cyanosis or  pallor.  Gastrointestinal:  Soft, nondistended, mild generalized ttp. No rebound or guarding. Normal bowel sounds. No appreciable masses or hepatomegaly. Rectal:  Not performed.  Psychiatric: Demonstrates good judgement and reason without abnormal affect or behaviors.  RELEVANT LABS AND IMAGING: CBC    Component Value Date/Time   WBC 5.1 03/18/2019 1125   RBC 4.62 03/18/2019 1125   HGB 14.0 03/18/2019 1125   HCT 42.5 03/18/2019 1125   PLT 228.0 03/18/2019 1125   MCV 92.0 03/18/2019 1125   MCH 30.2 10/02/2018 1127   MCHC 32.9 03/18/2019 1125   RDW 12.6 03/18/2019 1125   LYMPHSABS 1.9 03/18/2019 1125   MONOABS 0.4 03/18/2019 1125   EOSABS 0.2 03/18/2019 1125   BASOSABS 0.0 03/18/2019 1125    CMP     Component Value Date/Time   NA 138 03/18/2019 1125   K 3.6 03/18/2019 1125   CL 105 03/18/2019 1125   CO2 26 03/18/2019 1125   GLUCOSE 89 03/18/2019 1125   BUN 14 03/18/2019 1125   CREATININE 0.82 03/18/2019 1125   CALCIUM 9.5 03/18/2019 1125   PROT 7.2 03/18/2019 1125   ALBUMIN 4.4 03/18/2019 1125   AST 12 03/18/2019 1125   ALT 6 03/18/2019 1125   ALKPHOS 45 03/18/2019 1125   BILITOT 1.0 03/18/2019 1125   GFRNONAA >60 10/02/2015 1319   GFRAA >60 10/02/2015 1319    Assessment: 1.  Abdominal pain: Describes pain under her umbilicus, but takes Mylanta and Carafate which helps also describes pain as burning; consider upper GI source 2.  Diarrhea: With these flares of burning abdominal pain, colonoscopy in 2018 for the symptoms was normal; consider IBS versus gastritis versus other  3.  Family history of stomach cancer: In her grandmother in her  62s  Plan: 1.  Scheduled patient for an EGD in the [facility] with Dr. Loletha Carrow.  Did discuss risks, benefits, limitations and alternatives and the patient agrees to proceed.  Patient will be Covid tested 2 days prior to time of procedure. 2.  We discussed briefly that if the Carafate is helping and Mylanta sometimes helps then maybe she has gastritis/something that is referring a burning pain.  Also with a family history of stomach cancer in her grandmother would not be a bad idea to get EGD since she is having some GI symptoms. 3.  Started the patient on Omeprazole 20 mg daily, 30-60 minutes before breakfast.  Recommend the patient take this for a couple of weeks and see if it helps at all. 4.  Patient can continue Dicyclomine, increased it to 4 times daily, 20-30 minutes before meals and at bedtime.  Can also continue Carafate.  Discussed timing of this an hour before or 2 hours after eating and other medications. 5.  Patient to follow in clinic per recommendations from Dr. Loletha Carrow after time of procedure.  Ellouise Newer, PA-C [facility] 04/02/2019, 3:42 PM  Cc: No ref. provider found

## 2019-04-03 NOTE — Progress Notes (Signed)
____________________________________________________________  Attending physician addendum:  Thank you for sending this case to me. I have reviewed the entire note, and the outlined plan seems appropriate.  Overall, still most likely functional bowel disorder. After EGD, may proceed to CT scan A/P to be sure there is no evidence more proximal SB Crohn's.  TI normal during colonoscopy.  Amada Jupiter, MD  ____________________________________________________________

## 2019-04-08 ENCOUNTER — Ambulatory Visit (INDEPENDENT_AMBULATORY_CARE_PROVIDER_SITE_OTHER): Payer: 59

## 2019-04-08 DIAGNOSIS — Z1159 Encounter for screening for other viral diseases: Secondary | ICD-10-CM

## 2019-04-08 LAB — SARS CORONAVIRUS 2 (TAT 6-24 HRS): SARS Coronavirus 2: NEGATIVE

## 2019-04-10 ENCOUNTER — Ambulatory Visit (AMBULATORY_SURGERY_CENTER): Payer: 59 | Admitting: Gastroenterology

## 2019-04-10 ENCOUNTER — Encounter: Payer: Self-pay | Admitting: Gastroenterology

## 2019-04-10 ENCOUNTER — Other Ambulatory Visit: Payer: Self-pay

## 2019-04-10 VITALS — BP 101/69 | HR 58 | Temp 97.8°F | Resp 16 | Ht 66.0 in | Wt 121.0 lb

## 2019-04-10 DIAGNOSIS — R1084 Generalized abdominal pain: Secondary | ICD-10-CM

## 2019-04-10 DIAGNOSIS — K529 Noninfective gastroenteritis and colitis, unspecified: Secondary | ICD-10-CM | POA: Diagnosis not present

## 2019-04-10 MED ORDER — SODIUM CHLORIDE 0.9 % IV SOLN: 500.0000 mL | Freq: Once | INTRAVENOUS | Status: DC

## 2019-04-10 NOTE — Progress Notes (Signed)
Called to room to assist during endoscopic procedure.  Patient ID and intended procedure confirmed with present staff. Received instructions for my participation in the procedure from the performing physician.  

## 2019-04-10 NOTE — Progress Notes (Signed)
A and O x3. Report to RN. Tolerated MAC anesthesia well.Teeth unchanged after procedure.

## 2019-04-10 NOTE — Progress Notes (Signed)
Temp  LC  VS  DT   Pt's states no medical or surgical changes since previsit or office visit.  Admitting RN Reviewed

## 2019-04-10 NOTE — Op Note (Signed)
[facility] Patient Name: Beverly Stewart Procedure Date: 04/10/2019 4:20 PM MRN: [id number] Endoscopist: Mallie Mussel L. Loletha Carrow , MD Age: 34 Referring MD:  Date of Birth: 25-Aug-1985 Gender: Female Account #: [id number] Procedure:                Upper GI endoscopy Indications:              Generalized abdominal pain, Diarrhea (normal                            colonoscopy to TI in 2018) Medicines:                Monitored Anesthesia Care Procedure:                Pre-Anesthesia Assessment:                           - Prior to the procedure, a History and Physical                            was performed, and patient medications and                            allergies were reviewed. The patient's tolerance of                            previous anesthesia was also reviewed. The risks                            and benefits of the procedure and the sedation                            options and risks were discussed with the patient.                            All questions were answered, and informed consent                            was obtained. Prior Anticoagulants: The patient has                            taken no previous anticoagulant or antiplatelet                            agents. ASA Grade Assessment: II - A patient with                            mild systemic disease. After reviewing the risks                            and benefits, the patient was deemed in                            satisfactory condition to undergo the procedure.  After obtaining informed consent, the endoscope was                            passed under direct vision. Throughout the                            procedure, the patient's blood pressure, pulse, and                            oxygen saturations were monitored continuously. The                            Endoscope was introduced through the mouth, and                            advanced to the second part of  duodenum. The upper                            GI endoscopy was accomplished without difficulty.                            The patient tolerated the procedure well. Scope In: Scope Out: Findings:                 The esophagus was normal.                           The stomach was normal.                           The cardia and gastric fundus were normal on                            retroflexion.                           The examined duodenum was normal. Biopsies for                            histology were taken with a cold forceps for                            evaluation of celiac disease. Complications:            No immediate complications. Estimated Blood Loss:     Estimated blood loss was minimal. Impression:               - Normal esophagus.                           - Normal stomach.                           - Normal examined duodenum. Biopsied. Recommendation:           - Patient has a contact number available for  emergencies. The signs and symptoms of potential                            delayed complications were discussed with the                            patient. Return to normal activities tomorrow.                            Written discharge instructions were provided to the                            patient.                           - Resume previous diet.                           - Continue present medications. On medicine for                            suspected IBS.                           - Await pathology results. If normal, schedule CT                            enterography to rule out more proximal SB Crohn's. Kavina Cantave L. Myrtie Neither, MD 04/10/2019 4:49:55 PM This report has been signed electronically.

## 2019-04-10 NOTE — Patient Instructions (Signed)
YOU HAD AN ENDOSCOPIC PROCEDURE TODAY AT THE [facility]:   Refer to the procedure report that was given to you for any specific questions about what was found during the examination.  If the procedure report does not answer your questions, please call your gastroenterologist to clarify.  If you requested that your care partner not be given the details of your procedure findings, then the procedure report has been included in a sealed envelope for you to review at your convenience later.  YOU SHOULD EXPECT: Some feelings of bloating in the abdomen. Passage of more gas than usual.  Walking can help get rid of the air that was put into your GI tract during the procedure and reduce the bloating. If you had a lower endoscopy (such as a colonoscopy or flexible sigmoidoscopy) you may notice spotting of blood in your stool or on the toilet paper. If you underwent a bowel prep for your procedure, you may not have a normal bowel movement for a few days.  Please Note:  You might notice some irritation and congestion in your nose or some drainage.  This is from the oxygen used during your procedure.  There is no need for concern and it should clear up in a day or so.  SYMPTOMS TO REPORT IMMEDIATELY:   Following upper endoscopy (EGD)  Vomiting of blood or coffee ground material  New chest pain or pain under the shoulder blades  Painful or persistently difficult swallowing  New shortness of breath  Fever of 100F or higher  Black, tarry-looking stools  For urgent or emergent issues, a gastroenterologist can be reached at any hour by calling [phone].   DIET:  We do recommend a small meal at first, but then you may proceed to your regular diet.  Drink plenty of fluids but you should avoid alcoholic beverages for 24 hours.  ACTIVITY:  You should plan to take it easy for the rest of today and you should NOT DRIVE or use heavy machinery until tomorrow (because of the sedation medicines used  during the test).    FOLLOW UP: Our staff will call the number listed on your records 48-72 hours following your procedure to check on you and address any questions or concerns that you may have regarding the information given to you following your procedure. If we do not reach you, we will leave a message.  We will attempt to reach you two times.  During this call, we will ask if you have developed any symptoms of COVID 19. If you develop any symptoms (ie: fever, flu-like symptoms, shortness of breath, cough etc.) before then, please call [phone].  If you test positive for Covid 19 in the 2 weeks post procedure, please call and report this information to us.    If any biopsies were taken you will be contacted by phone or by letter within the next 1-3 weeks.  Please call us at [phone] if you have not heard about the biopsies in 3 weeks.    SIGNATURES/CONFIDENTIALITY: You and/or your care partner have signed paperwork which will be entered into your electronic medical record.  These signatures attest to the fact that that the information above on your After Visit Summary has been reviewed and is understood.  Full responsibility of the confidentiality of this discharge information lies with you and/or your care-partner. 

## 2019-04-14 ENCOUNTER — Telehealth: Payer: Self-pay

## 2019-04-14 NOTE — Telephone Encounter (Signed)
First attempt follow up call to pt, lm on vm 

## 2019-04-14 NOTE — Telephone Encounter (Signed)
Left message on follow up call. 

## 2019-04-20 ENCOUNTER — Other Ambulatory Visit: Payer: Self-pay | Admitting: *Deleted

## 2019-04-20 ENCOUNTER — Telehealth: Payer: Self-pay | Admitting: Gastroenterology

## 2019-04-20 DIAGNOSIS — G8929 Other chronic pain: Secondary | ICD-10-CM

## 2019-04-20 DIAGNOSIS — K529 Noninfective gastroenteritis and colitis, unspecified: Secondary | ICD-10-CM

## 2019-04-20 DIAGNOSIS — R1084 Generalized abdominal pain: Secondary | ICD-10-CM

## 2019-04-20 NOTE — Telephone Encounter (Signed)
See result note.  

## 2019-04-20 NOTE — Telephone Encounter (Signed)
Patient is returning your call.  

## 2019-05-07 ENCOUNTER — Ambulatory Visit (HOSPITAL_COMMUNITY): Admission: RE | Admit: 2019-05-07 | Payer: 59 | Source: Ambulatory Visit

## 2019-05-19 ENCOUNTER — Ambulatory Visit: Payer: 59 | Admitting: Gastroenterology

## 2019-05-28 ENCOUNTER — Ambulatory Visit (HOSPITAL_COMMUNITY)
Admission: RE | Admit: 2019-05-28 | Discharge: 2019-05-28 | Disposition: A | Discharge status: Home / Self Care | Payer: 59 | Source: Ambulatory Visit | Attending: Gastroenterology | Admitting: Gastroenterology

## 2019-05-28 ENCOUNTER — Other Ambulatory Visit: Payer: Self-pay

## 2019-05-28 DIAGNOSIS — R1084 Generalized abdominal pain: Secondary | ICD-10-CM | POA: Insufficient documentation

## 2019-05-28 DIAGNOSIS — K529 Noninfective gastroenteritis and colitis, unspecified: Secondary | ICD-10-CM | POA: Diagnosis not present

## 2019-05-28 MED ORDER — BARIUM SULFATE 0.1 % PO SUSP
ORAL | Status: AC
  Filled 2019-05-28: qty 3

## 2019-05-28 MED ORDER — SODIUM CHLORIDE (PF) 0.9 % IJ SOLN
INTRAMUSCULAR | Status: AC
  Filled 2019-05-28: qty 50

## 2019-05-28 MED ORDER — IOHEXOL 300 MG/ML  SOLN
100.0000 mL | Freq: Once | INTRAMUSCULAR | Status: AC | PRN
  Administered 2019-05-28: 100 mL via INTRAVENOUS

## 2019-06-01 ENCOUNTER — Encounter: Payer: Self-pay | Admitting: *Deleted

## 2019-06-12 ENCOUNTER — Encounter: Payer: Self-pay | Admitting: Gastroenterology

## 2019-06-12 ENCOUNTER — Ambulatory Visit (INDEPENDENT_AMBULATORY_CARE_PROVIDER_SITE_OTHER): Payer: 59 | Admitting: Gastroenterology

## 2019-06-12 VITALS — BP 102/60 | HR 69 | Temp 97.7°F | Ht 66.0 in | Wt 124.0 lb

## 2019-06-12 DIAGNOSIS — K580 Irritable bowel syndrome with diarrhea: Secondary | ICD-10-CM | POA: Diagnosis not present

## 2019-06-12 NOTE — Progress Notes (Signed)
[facility] Progress Note  Chief Complaint: IBS  Subjective  History: Seen in 2018 for abdominal pain and diarrhea, suspected IBS. Previous trial of Librax had not been helpful. Seen January 14 describing continued "flares" of lower abdominal and periumbilical burning with watery diarrhea lasting days or weeks.  She expressed some concern about a family history of stomach cancer.  EGD January 22 normal, duodenal biopsies negative for sprue. CT enterography ordered to rule out more proximal small bowel Crohn's disease (TI was normal in 2018 colonoscopy)  Hollye has been feeling well overall since the January office visit when she started dicyclomine and sucralfate.  She is now down to taking them just perhaps once a week when she feels some lower abdominal pain or diarrhea.  No rectal bleeding, appetite is good and weight stable.  ROS: Cardiovascular:  no chest pain Respiratory: no dyspnea  The patient's Past Medical, Family and Social History were reviewed and are on file in the EMR.  Objective:  Med list reviewed  Current Outpatient Medications:  .  dicyclomine (BENTYL) 20 MG tablet, Take 1 tablet (20 mg total) by mouth 4 (four) times daily., Disp: 120 tablet, Rfl: 5 .  Multiple Vitamin (MULTIVITAMIN) tablet, Take 1 tablet by mouth daily., Disp: , Rfl:  .  sucralfate (CARAFATE) 1 g tablet, Take 1 tablet (1 g total) by mouth 4 (four) times daily -  with meals and at bedtime., Disp: 60 tablet, Rfl: 5   Vital signs in last 24 hrs: Vitals:   06/12/19 1452  BP: 102/60  Pulse: 69  Temp: 97.7 F (36.5 C)    Physical Exam  Well-appearing  Cardiac: RRR without murmurs, S1S2 heard, no peripheral edema  Pulm: clear to auscultation bilaterally, normal RR and effort noted  Abdomen: soft, no tenderness, with active bowel sounds. No guarding or palpable hepatosplenomegaly.  Skin; warm and dry, no jaundice or rash  Labs:   ___________________________________________  Radiologic studies:  CLINICAL DATA:  Abdominal pain and diarrhea. Evaluate for Crohn's disease.   EXAM: CT ABDOMEN AND PELVIS WITH CONTRAST (ENTEROGRAPHY)   TECHNIQUE: Multidetector CT of the abdomen and pelvis during bolus administration of intravenous contrast. Negative oral contrast was given.   CONTRAST:  177mL OMNIPAQUE IOHEXOL 300 MG/ML  SOLN   COMPARISON:  None   FINDINGS: Lower chest:  No acute findings.   Hepatobiliary: Nodular enhancing lesion in lateral segment of left lobe of liver measures 1 cm, image 14/2. Collapsed gallbladder. No gallstones, gallbladder inflammation or biliary ductal dilatation   Pancreas: No mass, inflammatory changes, or other significant abnormality.   Spleen: Within normal limits in size and appearance.   Adrenals/Urinary Tract: Normal appearance of the adrenal glands. Kidneys are unremarkable. No mass or hydronephrosis. Urinary bladder unremarkable.   Stomach/Bowel: Stomach is normal. No small bowel wall thickening, inflammation or distension identified. The appendix is visualized and appears normal. Colon is unremarkable.   Vascular/Lymphatic: No pathologically enlarged lymph nodes. No evidence of abdominal aortic aneurysm   Reproductive: Heterogeneous enhancement of the uterus. No adnexal mass.   Other: There is no ascites or fluid collection identified within the abdomen or pelvis.   Musculoskeletal: No suspicious bone lesions identified.   IMPRESSION: 1. No acute findings identified within the abdomen or pelvis. No findings of acute or chronic inflammatory bowel disease. 2. Enhancing lesion within the left lobe of liver is identified measuring 1 cm. This is favored to represent a benign abnormality such as a flash fill hemangioma.  Electronically Signed   By: Signa Kell M.D.   On: 05/28/2019 14:08  ____________________________________________ Other:   _____________________________________________  Assessment & Plan  Assessment: Encounter Diagnosis  Name Primary?  . Irritable bowel syndrome with diarrhea Yes   Symptoms lately improved and stable on low-dose as needed dicyclomine.   Plan: Discontinue sucralfate Continue dicyclomine as needed See me as needed   15 minutes were spent on this encounter (including chart review, history/exam, counseling/coordination of care, and documentation)  Charlie Pitter III

## 2019-06-12 NOTE — Patient Instructions (Addendum)
If you are age 32 or older, your body mass index should be between 23-30. Your Body mass index is 20.01 kg/m. If this is out of the aforementioned range listed, please consider follow up with your Primary Care Provider.  If you are age 34 or younger, your body mass index should be between 19-25. Your Body mass index is 20.01 kg/m. If this is out of the aformentioned range listed, please consider follow up with your Primary Care Provider.   Please stop the sucralfate.   It was a pleasure to see you today!  Dr. Myrtie Neither

## 2019-10-11 IMAGING — US OBSTETRIC <14 WK US AND TRANSVAGINAL OB US
1 series · 15 of 28 positions shown · non-contrast
Comparison: None.

CLINICAL DATA: Vaginal bleeding in 1st trimester pregnancy.

EXAM:
OBSTETRIC <14 WK US AND TRANSVAGINAL OB US
TECHNIQUE: Both transabdominal and transvaginal ultrasound examinations were
performed for complete evaluation of the gestation as well as the
maternal uterus, adnexal regions, and pelvic cul-de-sac.
Transvaginal technique was performed to assess early pregnancy.

[Series 1: obstetric <14 wk us and transvaginal ob us · 15 of 55 slices shown]
[im 1/55]
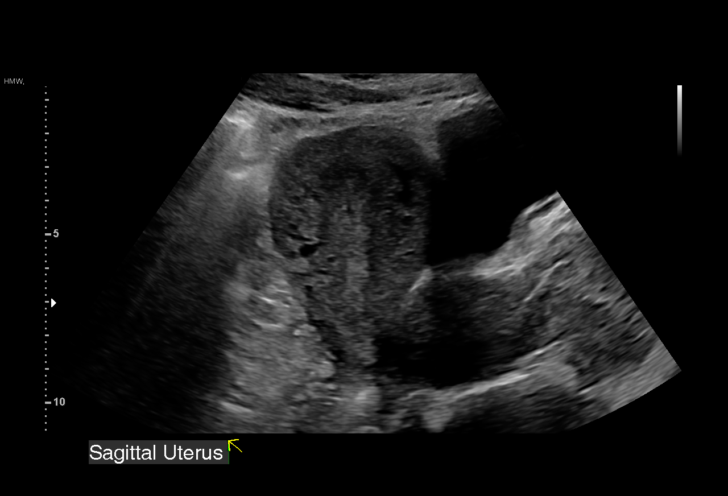
[im 5/55]
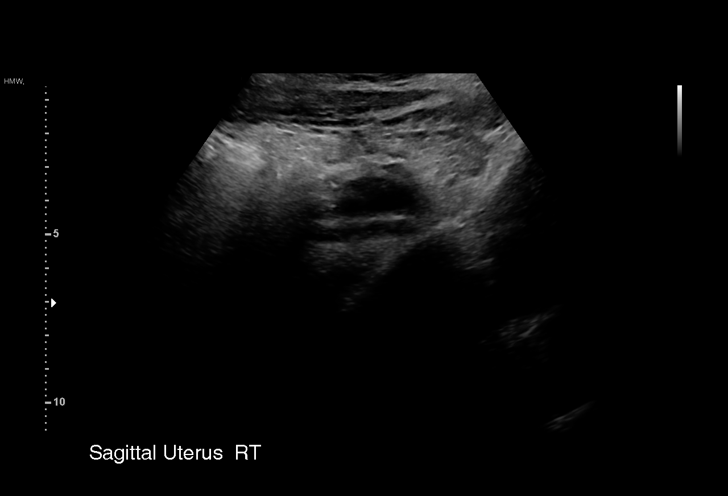
[im 9/55]
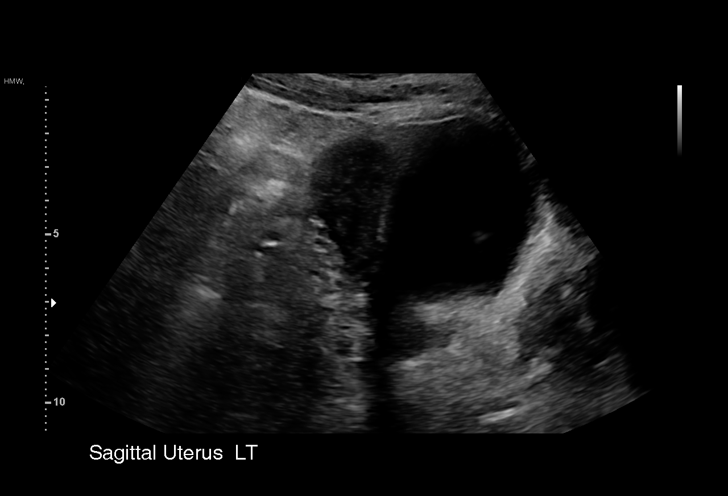
[im 13/55]
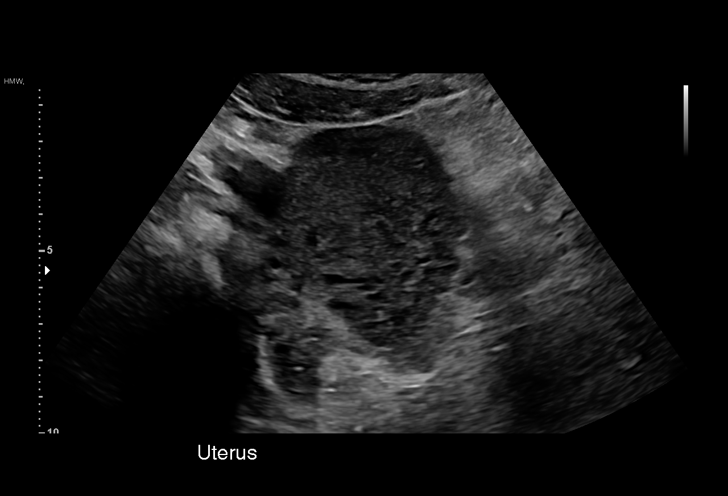
[im 17/55]
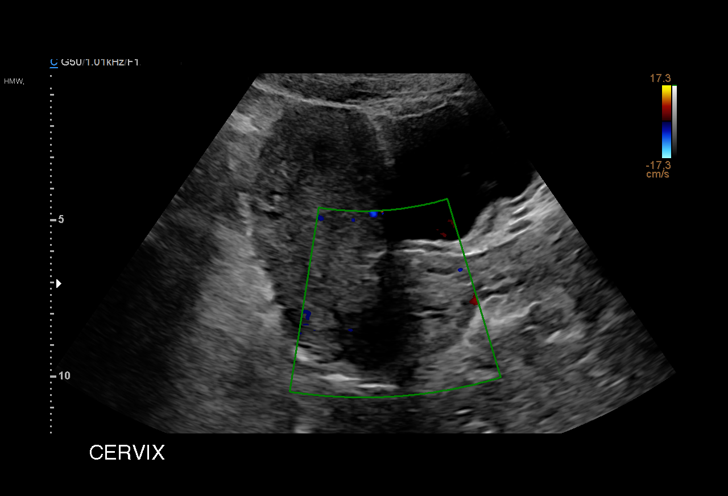
[im 21/55]
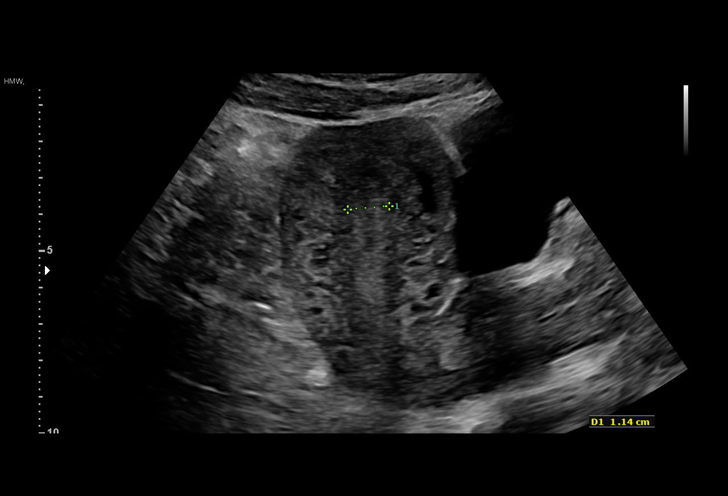
[im 25/55]
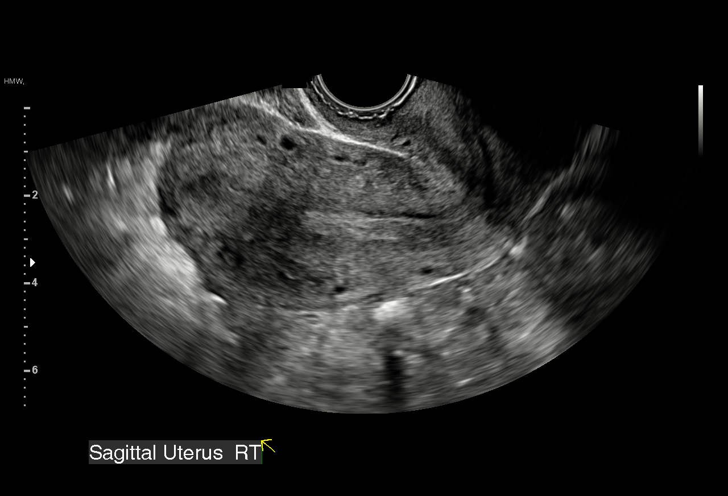
[im 29/55]
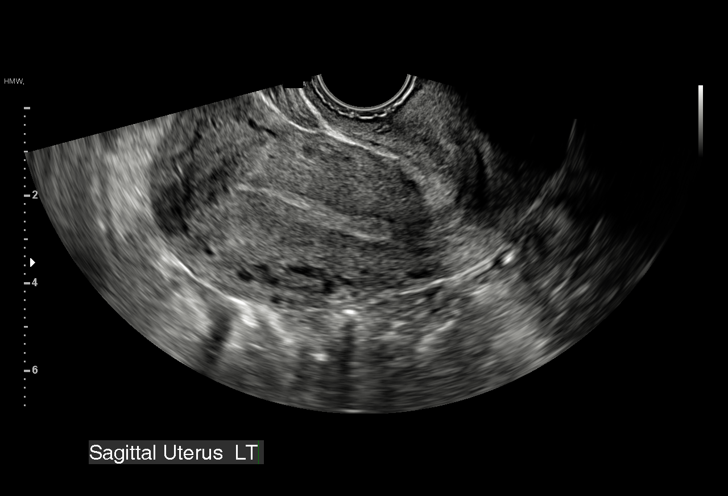
[im 31/55]
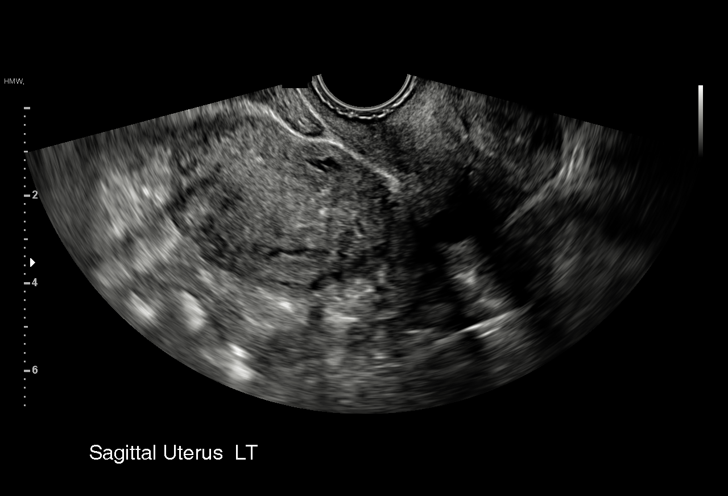
[im 35/55]
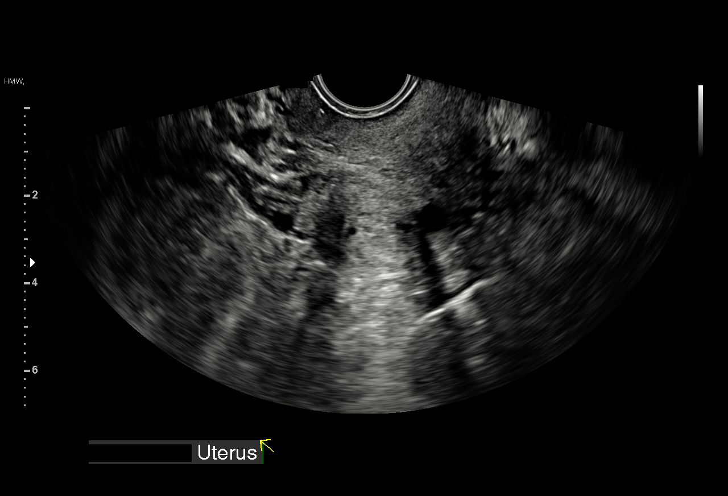
[im 39/55]
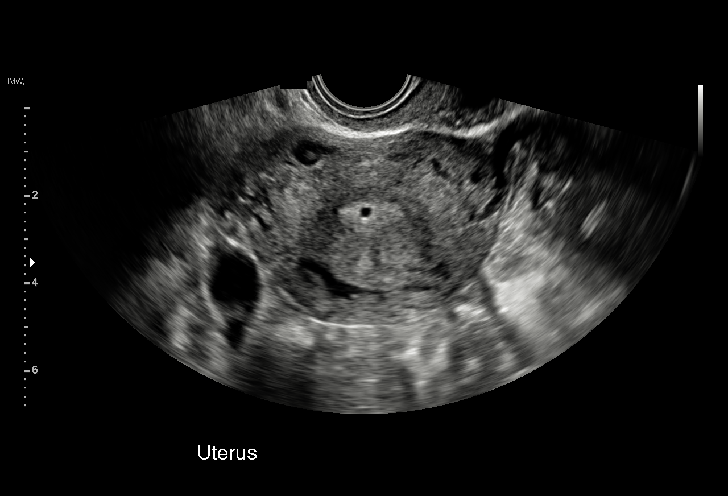
[im 43/55]
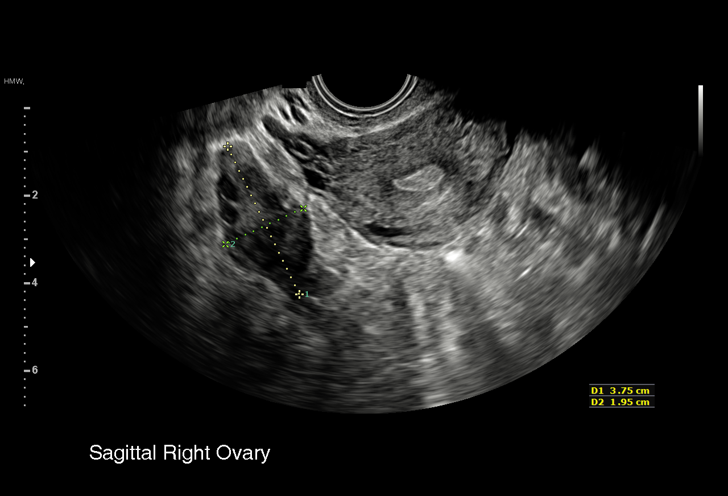
[im 47/55]
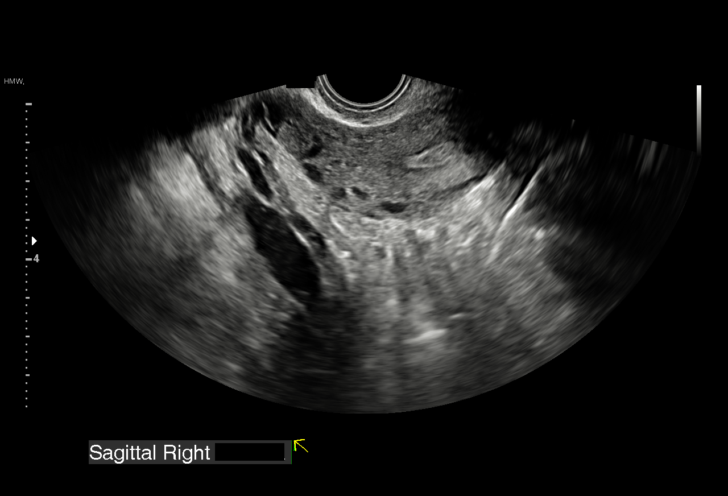
[im 51/55]
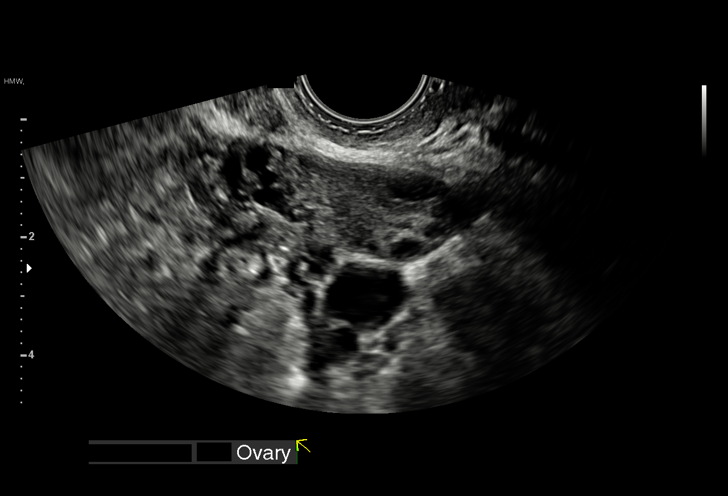
[im 55/55]
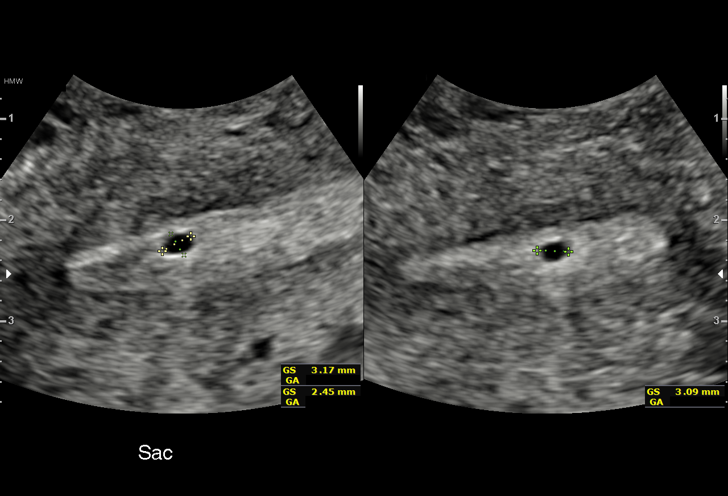

[15 of 28 positions shown; findings below may reference images not displayed]

FINDINGS: Intrauterine gestational sac: Single, with double decidual sac sign

Yolk sac:  Not Visualized.

Embryo:  Not Visualized.

MSD: 3 mm   4 w   6 d

Subchorionic hemorrhage:  None visualized.

Maternal uterus/adnexae: Normal appearance of both ovaries. No mass
or abnormal free fluid identified.
IMPRESSION: Single early approximately 5 week intrauterine gestational sac.
Suggest correlation with serial b-hCG levels, and consider followup
ultrasound to assess viability in 14 days.

No significant maternal uterine or adnexal abnormality identified.

## 2020-07-18 LAB — HEPATITIS C ANTIBODY: HCV Ab: NEGATIVE

## 2020-07-18 LAB — OB RESULTS CONSOLE RUBELLA ANTIBODY, IGM: Rubella: IMMUNE

## 2020-07-18 LAB — OB RESULTS CONSOLE ABO/RH: RH Type: POSITIVE

## 2020-07-18 LAB — OB RESULTS CONSOLE ANTIBODY SCREEN: Antibody Screen: NEGATIVE

## 2020-07-18 LAB — OB RESULTS CONSOLE GC/CHLAMYDIA
Chlamydia: NEGATIVE
Gonorrhea: NEGATIVE

## 2020-07-18 LAB — OB RESULTS CONSOLE RPR: RPR: NONREACTIVE

## 2020-07-18 LAB — OB RESULTS CONSOLE HIV ANTIBODY (ROUTINE TESTING): HIV: NONREACTIVE

## 2020-07-18 LAB — OB RESULTS CONSOLE HEPATITIS B SURFACE ANTIGEN: Hepatitis B Surface Ag: NEGATIVE

## 2020-10-18 LAB — OB RESULTS CONSOLE GC/CHLAMYDIA
Chlamydia: NEGATIVE
Gonorrhea: NEGATIVE

## 2020-10-18 LAB — OB RESULTS CONSOLE HIV ANTIBODY (ROUTINE TESTING): HIV: NONREACTIVE

## 2020-10-18 LAB — OB RESULTS CONSOLE ABO/RH: RH Type: POSITIVE

## 2020-10-18 LAB — OB RESULTS CONSOLE RPR: RPR: NONREACTIVE

## 2020-10-18 LAB — OB RESULTS CONSOLE ANTIBODY SCREEN: Antibody Screen: NEGATIVE

## 2020-10-18 LAB — HEPATITIS C ANTIBODY: HCV Ab: NEGATIVE

## 2020-10-18 LAB — OB RESULTS CONSOLE HEPATITIS B SURFACE ANTIGEN: Hepatitis B Surface Ag: NEGATIVE

## 2020-10-18 LAB — OB RESULTS CONSOLE RUBELLA ANTIBODY, IGM: Rubella: IMMUNE

## 2020-10-26 ENCOUNTER — Encounter (HOSPITAL_BASED_OUTPATIENT_CLINIC_OR_DEPARTMENT_OTHER): Payer: Self-pay | Admitting: Emergency Medicine

## 2020-10-26 ENCOUNTER — Emergency Department (HOSPITAL_BASED_OUTPATIENT_CLINIC_OR_DEPARTMENT_OTHER)
Admission: EM | Admit: 2020-10-26 | Discharge: 2020-10-26 | Disposition: A | Discharge status: Home / Self Care | Payer: 59 | Attending: Emergency Medicine | Admitting: Emergency Medicine

## 2020-10-26 ENCOUNTER — Other Ambulatory Visit: Payer: Self-pay

## 2020-10-26 DIAGNOSIS — Z3A12 12 weeks gestation of pregnancy: Secondary | ICD-10-CM | POA: Insufficient documentation

## 2020-10-26 DIAGNOSIS — O2301 Infections of kidney in pregnancy, first trimester: Secondary | ICD-10-CM | POA: Diagnosis present

## 2020-10-26 DIAGNOSIS — N12 Tubulo-interstitial nephritis, not specified as acute or chronic: Secondary | ICD-10-CM | POA: Insufficient documentation

## 2020-10-26 DIAGNOSIS — Z87891 Personal history of nicotine dependence: Secondary | ICD-10-CM | POA: Diagnosis not present

## 2020-10-26 DIAGNOSIS — N10 Acute tubulo-interstitial nephritis: Secondary | ICD-10-CM

## 2020-10-26 DIAGNOSIS — R109 Unspecified abdominal pain: Secondary | ICD-10-CM

## 2020-10-26 LAB — URINALYSIS, ROUTINE W REFLEX MICROSCOPIC
Bilirubin Urine: NEGATIVE
Glucose, UA: >=500 mg/dL — AB
Ketones, ur: NEGATIVE mg/dL
Leukocytes,Ua: NEGATIVE
Nitrite: NEGATIVE
Protein, ur: NEGATIVE mg/dL
Specific Gravity, Urine: 1.025 (ref 1.005–1.030)
pH: 5.5 (ref 5.0–8.0)

## 2020-10-26 LAB — URINALYSIS, MICROSCOPIC (REFLEX)

## 2020-10-26 LAB — PREGNANCY, URINE: Preg Test, Ur: POSITIVE — AB

## 2020-10-26 MED ORDER — CEPHALEXIN 500 MG PO CAPS: 500.0000 mg | ORAL_CAPSULE | Freq: Four times a day (QID) | ORAL | 0 refills | Status: AC

## 2020-10-26 MED ORDER — CEPHALEXIN 500 MG PO CAPS: 500.0000 mg | ORAL_CAPSULE | Freq: Four times a day (QID) | ORAL | 0 refills | Status: DC

## 2020-10-26 NOTE — ED Provider Notes (Signed)
[facility] EMERGENCY DEPARTMENT Provider Note   CSN: [id number] Arrival date & time: 10/26/20  0045     History Chief Complaint  Patient presents with   Flank Pain    Beverly Stewart is a 35 y.o. female.  HPI     Got up to go to the bathroom tonight Severe, stabbing pain right upper and right lower pain, was 10/10, lasted about 1 hour at that severity, now 3/10 pain, not as sharp as it was.   No hx of pain like that before, no hx of nephrolithiasis.  No urinary symptoms Urinating more often with pregnancy No dysuria Diarrhea more recently, but has had alternating in pregnancy Nausea and vomiting with pregnancy No fevers No vaginal bleeding or discharge [facility] Low appetite since pregnant, vomiting increased a few days ago but is better right now.  Taking tums.   Past Medical History:  Diagnosis Date   Allergy    Infection    UTI   Seasonal allergies    Vaginal Pap smear, abnormal    colpo, ok since    Patient Active Problem List   Diagnosis Date Noted   CIN I (cervical intraepithelial neoplasia I) 07/05/2016    Past Surgical History:  Procedure Laterality Date   COLONOSCOPY     NO PAST SURGERIES       OB History     Gravida  2   Para      Term      Preterm      AB  1   Living         SAB      IAB  1   Ectopic      Multiple      Live Births              Family History  Problem Relation Age of Onset   Diabetes Father    Hypertension Maternal Grandmother    Diabetes Maternal Grandmother    Kidney failure Maternal Grandmother        dialysis   Asthma Maternal Grandmother    Kidney disease Maternal Grandmother    Stomach cancer Maternal Grandmother    Stroke Maternal Grandfather    Colon cancer Maternal Grandfather        mets   Birth defects Maternal Grandfather    Hypertension Mother    Seizures Mother    Breast cancer Other    Esophageal cancer Neg Hx    Rectal cancer Neg Hx     Social History    Tobacco Use   Smoking status: Former    Types: Cigarettes    Quit date: 04/27/2015    Years since quitting: 5.5   Smokeless tobacco: Never   Tobacco comments:    only tried for a wk  Vaping Use   Vaping Use: Never used  Substance Use Topics   Alcohol use: Not Currently    Comment: ocassionally   Drug use: No    Home Medications Prior to Admission medications   Medication Sig Start Date End Date Taking? Authorizing Provider  cephALEXin (KEFLEX) 500 MG capsule Take 1 capsule (500 mg total) by mouth 4 (four) times daily for 10 days. 10/26/20 11/05/20  Alvira Monday, MD  dicyclomine (BENTYL) 20 MG tablet Take 1 tablet (20 mg total) by mouth 4 (four) times daily. 04/02/19   Unk Lightning, PA  Multiple Vitamin (MULTIVITAMIN) tablet Take 1 tablet by mouth daily.    [provider]  sucralfate (CARAFATE)  1 g tablet Take 1 tablet (1 g total) by mouth 4 (four) times daily -  with meals and at bedtime. 04/02/19   Unk Lightning, PA    Allergies    Patient has no known allergies.  Review of Systems   Review of Systems  Constitutional:  Negative for fever.  HENT:  Negative for sore throat.   Eyes:  Negative for visual disturbance.  Respiratory:  Negative for cough and shortness of breath.   Cardiovascular:  Negative for chest pain.  Gastrointestinal:  Positive for abdominal pain, nausea and vomiting. Negative for constipation and diarrhea.  Genitourinary:  Positive for flank pain and frequency. Negative for difficulty urinating and dysuria.  Musculoskeletal:  Negative for back pain.  Skin:  Negative for rash.  Neurological:  Negative for syncope and headaches.   Physical Exam Updated Vital Signs BP 124/77 (BP Location: Right Arm)   Pulse 77   Temp 97.8 F (36.6 C) (Oral)   Resp 18   Ht 5\' 6"  (1.676 m)   Wt 60.8 kg   SpO2 100%   BMI 21.63 kg/m   Physical Exam Vitals and nursing note reviewed.  Constitutional:      General: She is not in acute  distress.    Appearance: Normal appearance. She is not ill-appearing, toxic-appearing or diaphoretic.  HENT:     Head: Normocephalic.  Eyes:     Conjunctiva/sclera: Conjunctivae normal.  Cardiovascular:     Rate and Rhythm: Normal rate and regular rhythm.     Pulses: Normal pulses.  Pulmonary:     Effort: Pulmonary effort is normal. No respiratory distress.  Abdominal:     Tenderness: There is abdominal tenderness (right lateral abdomen towards flank, neg murphy's, no RUQ tenderness, no RLQ tenderness).  Musculoskeletal:        General: No deformity or signs of injury.     Cervical back: No rigidity.  Skin:    General: Skin is warm and dry.     Coloration: Skin is not jaundiced or pale.  Neurological:     General: No focal deficit present.     Mental Status: She is alert and oriented to person, place, and time.    ED Results / Procedures / Treatments   Labs (all labs ordered are listed, but only abnormal results are displayed) Labs Reviewed  URINALYSIS, ROUTINE W REFLEX MICROSCOPIC - Abnormal; Notable for the following components:      Result Value   Glucose, UA >=500 (*)    Hgb urine dipstick SMALL (*)    All other components within normal limits  PREGNANCY, URINE - Abnormal; Notable for the following components:   Preg Test, Ur POSITIVE (*)    All other components within normal limits  URINALYSIS, MICROSCOPIC (REFLEX) - Abnormal; Notable for the following components:   Bacteria, UA MANY (*)    All other components within normal limits    EKG None  Radiology No results found.  Procedures Procedures   Medications Ordered in ED Medications - No data to display  ED Course  I have reviewed the triage vital signs and the nursing notes.  Pertinent labs & imaging results that were available during my care of the patient were reviewed by me and considered in my medical decision making (see chart for details).    MDM Rules/Calculators/A&P                            35yo  at 12wk  presents with concern for right sided abdominal pain.  Sudden onset, stabbing pain after getting up to use the bathroom, no acute changes in appetite, nausea, no fevers.  Overall by history and exam have low suspicion for appendicitis or cholecystitis, however discussed return precautions.  Pain has improved significantly and low suspicion at this time for continued nephrolithiasis if this was potential etiology. Has had [place] and does not have ectopic pregnancy.  Pain significantly improved.  UA with bacteria--no fever, no increaed n/v hemodynamically stable, feel it is reasonable to treat for UTI, possible pyelonephritis as an outpatient and also consider round ligament pain as etiology of initial symptoms. Given rx for keflex, recommend close follow up and strict return precautions.      Final Clinical Impression(s) / ED Diagnoses Final diagnoses:  Right flank pain  Right sided abdominal pain  Acute pyelonephritis    Rx / DC Orders ED Discharge Orders          Ordered    cephALEXin (KEFLEX) 500 MG capsule  4 times daily,   Status:  Discontinued        10/26/20 0301    cephALEXin (KEFLEX) 500 MG capsule  4 times daily        10/26/20 0302             Alvira Monday, MD 10/26/20 1622

## 2020-10-26 NOTE — ED Triage Notes (Signed)
Pt reports she is [redacted] weeks pregnant and is having right sided flank pain; denies any N/V/D or vaginal bleeding

## 2021-01-30 ENCOUNTER — Encounter (HOSPITAL_COMMUNITY): Payer: Self-pay | Admitting: Obstetrics and Gynecology

## 2021-01-30 ENCOUNTER — Other Ambulatory Visit: Payer: Self-pay

## 2021-01-30 ENCOUNTER — Inpatient Hospital Stay (HOSPITAL_COMMUNITY)
Admission: AD | Admit: 2021-01-30 | Discharge: 2021-01-30 | Disposition: A | Discharge status: Home / Self Care | Payer: 59 | Attending: Obstetrics and Gynecology | Admitting: Obstetrics and Gynecology

## 2021-01-30 DIAGNOSIS — Z3A26 26 weeks gestation of pregnancy: Secondary | ICD-10-CM | POA: Insufficient documentation

## 2021-01-30 DIAGNOSIS — R102 Pelvic and perineal pain: Secondary | ICD-10-CM | POA: Diagnosis not present

## 2021-01-30 DIAGNOSIS — O26892 Other specified pregnancy related conditions, second trimester: Secondary | ICD-10-CM | POA: Diagnosis not present

## 2021-01-30 DIAGNOSIS — Z87891 Personal history of nicotine dependence: Secondary | ICD-10-CM | POA: Diagnosis not present

## 2021-01-30 LAB — URINALYSIS, ROUTINE W REFLEX MICROSCOPIC
Bilirubin Urine: NEGATIVE
Glucose, UA: 50 mg/dL — AB
Hgb urine dipstick: NEGATIVE
Ketones, ur: NEGATIVE mg/dL
Leukocytes,Ua: NEGATIVE
Nitrite: NEGATIVE
Protein, ur: NEGATIVE mg/dL
Specific Gravity, Urine: 1.009 (ref 1.005–1.030)
pH: 7 (ref 5.0–8.0)

## 2021-01-30 LAB — WET PREP, GENITAL
Clue Cells Wet Prep HPF POC: NONE SEEN
Sperm: NONE SEEN
Trich, Wet Prep: NONE SEEN
Yeast Wet Prep HPF POC: NONE SEEN

## 2021-01-30 NOTE — MAU Note (Signed)
Received 35 y/o, G2P0010 26 2/7 weeks, pt reported pressure in vaginal that started appx 4am, denies any vaginal bleeding, or leakage of fluid, + fetal movement, support person at bedside.

## 2021-01-30 NOTE — MAU Provider Note (Signed)
History     CSN: [id number]  Arrival date and time: 01/30/21 0530   None     No chief complaint on file.  HPI Beverly Stewart is a 35 y.o. G3P0020 at [redacted]w[redacted]d who presents to MAU for pelvic pressure since 4am. Patient reports she was getting ready for work when the pressure started. Sitting helps alleviate pressure. Patient works at the [employer]. She denies contractions, vaginal bleeding, leaking fluid, urinary s/s. No recent IC or exams. Endorses active fetal movement. Of note, patient admits to drinking only about 1 24 oz bottle of water/day.   OB History     Gravida  3   Para      Term      Preterm      AB  1   Living         SAB      IAB  1   Ectopic      Multiple      Live Births              Past Medical History:  Diagnosis Date   Allergy    Infection    UTI   Seasonal allergies    Vaginal Pap smear, abnormal    colpo, ok since    Past Surgical History:  Procedure Laterality Date   COLONOSCOPY     NO PAST SURGERIES      Family History  Problem Relation Age of Onset   Diabetes Father    Hypertension Maternal Grandmother    Diabetes Maternal Grandmother    Kidney failure Maternal Grandmother        dialysis   Asthma Maternal Grandmother    Kidney disease Maternal Grandmother    Stomach cancer Maternal Grandmother    Stroke Maternal Grandfather    Colon cancer Maternal Grandfather        mets   Birth defects Maternal Grandfather    Hypertension Mother    Seizures Mother    Breast cancer Other    Esophageal cancer Neg Hx    Rectal cancer Neg Hx     Social History   Tobacco Use   Smoking status: Former    Types: Cigarettes    Quit date: 04/27/2015    Years since quitting: 5.7   Smokeless tobacco: Never   Tobacco comments:    only tried for a wk  Vaping Use   Vaping Use: Never used  Substance Use Topics   Alcohol use: Not Currently    Comment: ocassionally   Drug use: No    Allergies: No Known Allergies  Medications  Prior to Admission  Medication Sig Dispense Refill Last Dose   Prenatal Vit-Fe Fumarate-FA (MULTIVITAMIN-PRENATAL) 27-0.8 MG TABS tablet Take 1 tablet by mouth daily at 12 noon.   01/29/2021   dicyclomine (BENTYL) 20 MG tablet Take 1 tablet (20 mg total) by mouth 4 (four) times daily. 120 tablet 5 Unknown   Multiple Vitamin (MULTIVITAMIN) tablet Take 1 tablet by mouth daily.   Unknown   sucralfate (CARAFATE) 1 g tablet Take 1 tablet (1 g total) by mouth 4 (four) times daily -  with meals and at bedtime. 60 tablet 5 Unknown    Review of Systems  Constitutional: Negative.   Respiratory: Negative.    Cardiovascular: Negative.   Gastrointestinal: Negative.   Genitourinary:  Positive for pelvic pain. Negative for dysuria, vaginal bleeding and vaginal discharge.  Musculoskeletal: Negative.   Neurological: Negative.   Physical Exam   Blood pressure 114/65,  pulse 79, temperature 98.1 F (36.7 C), temperature source Oral, resp. rate 20, SpO2 98 %, unknown if currently breastfeeding.  Physical Exam Constitutional:      General: She is not in acute distress.    Appearance: She is normal weight.  HENT:     Head: Normocephalic.  Eyes:     Pupils: Pupils are equal, round, and reactive to light.  Cardiovascular:     Rate and Rhythm: Normal rate.  Pulmonary:     Effort: Pulmonary effort is normal.  Abdominal:     Palpations: Abdomen is soft.     Tenderness: There is no abdominal tenderness.     Comments: Gravid   Genitourinary:    Vagina: Vaginal discharge (noted at vaginal introitus; blind swabs obtained) present.     Comments: Cervix closed/thick Skin:    General: Skin is warm and dry.  Neurological:     General: No focal deficit present.     Mental Status: She is alert and oriented to person, place, and time.  Psychiatric:        Mood and Affect: Mood normal.        Behavior: Behavior normal.        Thought Content: Thought content normal.        Judgment: Judgment normal.    NST FHR: 145bpm, moderate variability, accels reactive, decels none  Toco: occasional ui  MAU Course  Procedures  MDM UA Wet prep, GC/CT NST reactive and reassuring for gestational age 44 with ui initially, resolved with PO hydration  Assessment and Plan  Pelvic pain [redacted] weeks gestation  - Discharge home in stable condition - Recommend support belt, adequate water intake, stretches/yoga - Strict return precautions reviewed. Return to MAU as needed for worsening symptoms - Keep OB appointment as scheduled on Tuesday 02/07/21   Brand Males, CNM 01/30/2021, 8:17 AM

## 2021-01-31 LAB — GC/CHLAMYDIA PROBE AMP (~~LOC~~) NOT AT ARMC
Chlamydia: NEGATIVE
Comment: NEGATIVE
Comment: NORMAL
Neisseria Gonorrhea: NEGATIVE

## 2021-03-19 NOTE — L&D Delivery Note (Signed)
Operative Delivery Note She progressed to complete and pushed for around 90 minutes, bringing vtx to +3, she then c/o exhaustion.  At 5:15 PM a viable female was delivered via Vaginal, Vacuum Neurosurgeon).  Presentation: vertex; Position: Right,, Occiput,, Anterior; Station: +3.  Verbal consent: obtained from patient.  Risks and benefits discussed in detail.  Risks include, but are not limited to the risks of anesthesia, bleeding, infection, damage to maternal tissues, fetal cephalhematoma.  There is also the risk of inability to effect vaginal delivery of the head, or shoulder dystocia that cannot be resolved by established maneuvers, leading to the need for emergency cesarean section.  Vacuum assistance offered for maternal exhaustion  APGAR: 8, 9; weight pending.   Placenta status: spontaneous, intact.   Cord:  with the following complications: none.  Anesthesia:  Epidural Instruments: Kiwi, pulled with one ctx, green zone Episiotomy: None Lacerations: 2nd degree Suture Repair: 3.0 vicryl rapide Est. Blood Loss (mL): 300  Mom to postpartum.  Baby to Couplet care / Skin to Skin.  Will monitor BP, treat if necessary  Clarene Duke 04/30/2021, 5:37 PM

## 2021-04-18 LAB — OB RESULTS CONSOLE GBS: GBS: NEGATIVE

## 2021-04-26 ENCOUNTER — Telehealth (HOSPITAL_COMMUNITY): Payer: Self-pay | Admitting: *Deleted

## 2021-04-26 ENCOUNTER — Encounter (HOSPITAL_COMMUNITY): Payer: Self-pay | Admitting: *Deleted

## 2021-04-26 NOTE — Telephone Encounter (Signed)
Preadmission screen  

## 2021-04-28 ENCOUNTER — Other Ambulatory Visit (HOSPITAL_COMMUNITY): Payer: Self-pay | Admitting: *Deleted

## 2021-04-28 ENCOUNTER — Encounter (HOSPITAL_COMMUNITY): Payer: Self-pay | Admitting: *Deleted

## 2021-04-29 ENCOUNTER — Encounter (HOSPITAL_COMMUNITY): Payer: Self-pay | Admitting: Obstetrics and Gynecology

## 2021-04-29 ENCOUNTER — Other Ambulatory Visit: Payer: Self-pay

## 2021-04-29 ENCOUNTER — Inpatient Hospital Stay (HOSPITAL_COMMUNITY)
Admission: AD | Admit: 2021-04-29 | Discharge: 2021-05-02 | DRG: 807 | Disposition: A | Discharge status: Home / Self Care | Payer: 59 | Attending: Obstetrics and Gynecology | Admitting: Obstetrics and Gynecology

## 2021-04-29 DIAGNOSIS — Z87891 Personal history of nicotine dependence: Secondary | ICD-10-CM

## 2021-04-29 DIAGNOSIS — Z20822 Contact with and (suspected) exposure to covid-19: Secondary | ICD-10-CM | POA: Diagnosis present

## 2021-04-29 DIAGNOSIS — Z3A39 39 weeks gestation of pregnancy: Secondary | ICD-10-CM

## 2021-04-29 DIAGNOSIS — O1493 Unspecified pre-eclampsia, third trimester: Secondary | ICD-10-CM | POA: Diagnosis present

## 2021-04-29 DIAGNOSIS — O1404 Mild to moderate pre-eclampsia, complicating childbirth: Principal | ICD-10-CM | POA: Diagnosis present

## 2021-04-29 NOTE — MAU Note (Signed)
Beverly Stewart is a 36 y.o. at [redacted]w[redacted]d here in MAU reporting: contractions that began at 2000 tonight. Denies SROM, vaginal bleeding or bloody show. Endorses + fetal movement. GBS-. Denies HSV.   Pain score: 7 Vitals:   04/29/21 2333  BP: 128/90  Pulse: 84  Resp: 17  Temp: 97.9 F (36.6 C)  SpO2: 99%    BP retake:   137/88 FHT: 137bpm Lab orders placed from triage: MAU labor

## 2021-04-30 ENCOUNTER — Inpatient Hospital Stay (HOSPITAL_COMMUNITY): Payer: 59 | Admitting: Anesthesiology

## 2021-04-30 ENCOUNTER — Encounter (HOSPITAL_COMMUNITY): Payer: Self-pay | Admitting: Obstetrics and Gynecology

## 2021-04-30 DIAGNOSIS — Z20822 Contact with and (suspected) exposure to covid-19: Secondary | ICD-10-CM | POA: Diagnosis present

## 2021-04-30 DIAGNOSIS — O26893 Other specified pregnancy related conditions, third trimester: Secondary | ICD-10-CM | POA: Diagnosis present

## 2021-04-30 DIAGNOSIS — Z3A39 39 weeks gestation of pregnancy: Secondary | ICD-10-CM

## 2021-04-30 DIAGNOSIS — O1493 Unspecified pre-eclampsia, third trimester: Secondary | ICD-10-CM | POA: Diagnosis present

## 2021-04-30 DIAGNOSIS — Z87891 Personal history of nicotine dependence: Secondary | ICD-10-CM | POA: Diagnosis not present

## 2021-04-30 DIAGNOSIS — O1404 Mild to moderate pre-eclampsia, complicating childbirth: Secondary | ICD-10-CM | POA: Diagnosis present

## 2021-04-30 LAB — COMPREHENSIVE METABOLIC PANEL
ALT: 24 U/L (ref 0–44)
AST: 27 U/L (ref 15–41)
Albumin: 2.5 g/dL — ABNORMAL LOW (ref 3.5–5.0)
Alkaline Phosphatase: 151 U/L — ABNORMAL HIGH (ref 38–126)
Anion gap: 9 (ref 5–15)
BUN: 12 mg/dL (ref 6–20)
CO2: 18 mmol/L — ABNORMAL LOW (ref 22–32)
Calcium: 9.5 mg/dL (ref 8.9–10.3)
Chloride: 107 mmol/L (ref 98–111)
Creatinine, Ser: 0.88 mg/dL (ref 0.44–1.00)
GFR, Estimated: >60 mL/min (ref >60)
Glucose, Bld: 86 mg/dL (ref 70–99)
Potassium: 4 mmol/L (ref 3.5–5.1)
Sodium: 134 mmol/L — ABNORMAL LOW (ref 135–145)
Total Bilirubin: 0.6 mg/dL (ref 0.3–1.2)
Total Protein: 5.7 g/dL — ABNORMAL LOW (ref 6.5–8.1)

## 2021-04-30 LAB — TYPE AND SCREEN
ABO/RH(D): O POS
Antibody Screen: NEGATIVE

## 2021-04-30 LAB — CBC
HCT: 32.6 % — ABNORMAL LOW (ref 36.0–46.0)
HCT: 34.7 % — ABNORMAL LOW (ref 36.0–46.0)
Hemoglobin: 10.2 g/dL — ABNORMAL LOW (ref 12.0–15.0)
Hemoglobin: 11.2 g/dL — ABNORMAL LOW (ref 12.0–15.0)
MCH: 27.6 pg (ref 26.0–34.0)
MCH: 28.4 pg (ref 26.0–34.0)
MCHC: 31.3 g/dL (ref 30.0–36.0)
MCHC: 32.3 g/dL (ref 30.0–36.0)
MCV: 88.1 fL (ref 80.0–100.0)
MCV: 87.8 fL (ref 80.0–100.0)
Platelets: 216 10*3/uL (ref 150–400)
Platelets: 219 10*3/uL (ref 150–400)
RBC: 3.7 MIL/uL — ABNORMAL LOW (ref 3.87–5.11)
RBC: 3.95 MIL/uL (ref 3.87–5.11)
RDW: 14.4 % (ref 11.5–15.5)
RDW: 14.3 % (ref 11.5–15.5)
WBC: 9 10*3/uL (ref 4.0–10.5)
WBC: 9.3 10*3/uL (ref 4.0–10.5)
nRBC: 0.3 % — ABNORMAL HIGH (ref 0.0–0.2)
nRBC: 0.2 % (ref 0.0–0.2)

## 2021-04-30 LAB — PROTEIN / CREATININE RATIO, URINE
Creatinine, Urine: 159.27 mg/dL
Protein Creatinine Ratio: 0.43 mg/mg{Cre} — ABNORMAL HIGH (ref 0.00–0.15)
Total Protein, Urine: 69 mg/dL

## 2021-04-30 LAB — RESP PANEL BY RT-PCR (FLU A&B, COVID) ARPGX2
Influenza A by PCR: NEGATIVE
Influenza B by PCR: NEGATIVE
SARS Coronavirus 2 by RT PCR: NEGATIVE

## 2021-04-30 LAB — RPR: RPR Ser Ql: NONREACTIVE

## 2021-04-30 MED ORDER — PHENYLEPHRINE 40 MCG/ML (10ML) SYRINGE FOR IV PUSH (FOR BLOOD PRESSURE SUPPORT): 80.0000 ug | PREFILLED_SYRINGE | INTRAVENOUS | Status: DC | PRN

## 2021-04-30 MED ORDER — BUTORPHANOL TARTRATE 1 MG/ML IJ SOLN
1.0000 mg | INTRAMUSCULAR | Status: DC | PRN
  Administered 2021-04-30 (×2): 1 mg via INTRAVENOUS
  Filled 2021-04-30 (×2): qty 1

## 2021-04-30 MED ORDER — ONDANSETRON HCL 4 MG/2ML IJ SOLN: 4.0000 mg | INTRAMUSCULAR | Status: DC | PRN

## 2021-04-30 MED ORDER — FENTANYL-BUPIVACAINE-NACL 0.5-0.125-0.9 MG/250ML-% EP SOLN
12.0000 mL/h | EPIDURAL | Status: DC | PRN
  Filled 2021-04-30: qty 250

## 2021-04-30 MED ORDER — IBUPROFEN 600 MG PO TABS
600.0000 mg | ORAL_TABLET | Freq: Four times a day (QID) | ORAL | Status: DC
  Administered 2021-05-01 – 2021-05-02 (×7): 600 mg via ORAL
  Filled 2021-04-30 (×7): qty 1

## 2021-04-30 MED ORDER — DIPHENHYDRAMINE HCL 25 MG PO CAPS: 25.0000 mg | ORAL_CAPSULE | Freq: Four times a day (QID) | ORAL | Status: DC | PRN

## 2021-04-30 MED ORDER — SENNOSIDES-DOCUSATE SODIUM 8.6-50 MG PO TABS
2.0000 | ORAL_TABLET | Freq: Every day | ORAL | Status: DC
  Administered 2021-05-01 – 2021-05-02 (×2): 2 via ORAL
  Filled 2021-04-30 (×2): qty 2

## 2021-04-30 MED ORDER — LACTATED RINGERS IV SOLN: 500.0000 mL | Freq: Once | INTRAVENOUS | Status: DC

## 2021-04-30 MED ORDER — ONDANSETRON HCL 4 MG/2ML IJ SOLN
4.0000 mg | Freq: Four times a day (QID) | INTRAMUSCULAR | Status: DC | PRN
  Administered 2021-04-30: 4 mg via INTRAVENOUS
  Filled 2021-04-30: qty 2

## 2021-04-30 MED ORDER — TETANUS-DIPHTH-ACELL PERTUSSIS 5-2.5-18.5 LF-MCG/0.5 IM SUSY: 0.5000 mL | PREFILLED_SYRINGE | Freq: Once | INTRAMUSCULAR | Status: DC

## 2021-04-30 MED ORDER — LACTATED RINGERS IV BOLUS
250.0000 mL | Freq: Once | INTRAVENOUS | Status: AC
  Administered 2021-04-30: 250 mL via INTRAVENOUS

## 2021-04-30 MED ORDER — BENZOCAINE-MENTHOL 20-0.5 % EX AERO
1.0000 "application " | INHALATION_SPRAY | CUTANEOUS | Status: DC | PRN
  Filled 2021-04-30: qty 56

## 2021-04-30 MED ORDER — MAGNESIUM HYDROXIDE 400 MG/5ML PO SUSP: 30.0000 mL | ORAL | Status: DC | PRN

## 2021-04-30 MED ORDER — WITCH HAZEL-GLYCERIN EX PADS: 1.0000 "application " | MEDICATED_PAD | CUTANEOUS | Status: DC | PRN

## 2021-04-30 MED ORDER — OXYCODONE-ACETAMINOPHEN 5-325 MG PO TABS: 2.0000 | ORAL_TABLET | ORAL | Status: DC | PRN

## 2021-04-30 MED ORDER — SIMETHICONE 80 MG PO CHEW: 80.0000 mg | CHEWABLE_TABLET | ORAL | Status: DC | PRN

## 2021-04-30 MED ORDER — EPHEDRINE 5 MG/ML INJ: 10.0000 mg | INTRAVENOUS | Status: DC | PRN

## 2021-04-30 MED ORDER — LIDOCAINE HCL (PF) 1 % IJ SOLN
INTRAMUSCULAR | Status: DC | PRN
  Administered 2021-04-30: 5 mL via EPIDURAL

## 2021-04-30 MED ORDER — LABETALOL HCL 5 MG/ML IV SOLN: 20.0000 mg | INTRAVENOUS | Status: DC | PRN

## 2021-04-30 MED ORDER — METHYLERGONOVINE MALEATE 0.2 MG PO TABS: 0.2000 mg | ORAL_TABLET | ORAL | Status: DC | PRN

## 2021-04-30 MED ORDER — MEASLES, MUMPS & RUBELLA VAC IJ SOLR: 0.5000 mL | Freq: Once | INTRAMUSCULAR | Status: DC

## 2021-04-30 MED ORDER — COCONUT OIL OIL: 1.0000 "application " | TOPICAL_OIL | Status: DC | PRN

## 2021-04-30 MED ORDER — SOD CITRATE-CITRIC ACID 500-334 MG/5ML PO SOLN: 30.0000 mL | ORAL | Status: DC | PRN

## 2021-04-30 MED ORDER — OXYTOCIN BOLUS FROM INFUSION
333.0000 mL | Freq: Once | INTRAVENOUS | Status: AC
  Administered 2021-04-30: 333 mL via INTRAVENOUS

## 2021-04-30 MED ORDER — FENTANYL-BUPIVACAINE-NACL 0.5-0.125-0.9 MG/250ML-% EP SOLN
EPIDURAL | Status: DC | PRN
  Administered 2021-04-30: 12 mL/h via EPIDURAL

## 2021-04-30 MED ORDER — DIPHENHYDRAMINE HCL 50 MG/ML IJ SOLN: 12.5000 mg | INTRAMUSCULAR | Status: DC | PRN

## 2021-04-30 MED ORDER — DIBUCAINE (PERIANAL) 1 % EX OINT: 1.0000 "application " | TOPICAL_OINTMENT | CUTANEOUS | Status: DC | PRN

## 2021-04-30 MED ORDER — OXYCODONE-ACETAMINOPHEN 5-325 MG PO TABS: 1.0000 | ORAL_TABLET | ORAL | Status: DC | PRN

## 2021-04-30 MED ORDER — PHENYLEPHRINE 40 MCG/ML (10ML) SYRINGE FOR IV PUSH (FOR BLOOD PRESSURE SUPPORT)
80.0000 ug | PREFILLED_SYRINGE | INTRAVENOUS | Status: DC | PRN
  Filled 2021-04-30: qty 10

## 2021-04-30 MED ORDER — OXYTOCIN-SODIUM CHLORIDE 30-0.9 UT/500ML-% IV SOLN
1.0000 m[IU]/min | INTRAVENOUS | Status: DC
  Administered 2021-04-30: 2 m[IU]/min via INTRAVENOUS

## 2021-04-30 MED ORDER — LIDOCAINE HCL (PF) 1 % IJ SOLN: 30.0000 mL | INTRAMUSCULAR | Status: DC | PRN

## 2021-04-30 MED ORDER — HYDRALAZINE HCL 20 MG/ML IJ SOLN: 10.0000 mg | INTRAMUSCULAR | Status: DC | PRN

## 2021-04-30 MED ORDER — OXYTOCIN-SODIUM CHLORIDE 30-0.9 UT/500ML-% IV SOLN
2.5000 [IU]/h | INTRAVENOUS | Status: DC
  Filled 2021-04-30: qty 500

## 2021-04-30 MED ORDER — LACTATED RINGERS IV SOLN: INTRAVENOUS | Status: DC

## 2021-04-30 MED ORDER — ACETAMINOPHEN 325 MG PO TABS: 650.0000 mg | ORAL_TABLET | ORAL | Status: DC | PRN

## 2021-04-30 MED ORDER — METHYLERGONOVINE MALEATE 0.2 MG/ML IJ SOLN: 0.2000 mg | INTRAMUSCULAR | Status: DC | PRN

## 2021-04-30 MED ORDER — OXYCODONE HCL 5 MG PO TABS: 10.0000 mg | ORAL_TABLET | ORAL | Status: DC | PRN

## 2021-04-30 MED ORDER — TERBUTALINE SULFATE 1 MG/ML IJ SOLN: 0.2500 mg | Freq: Once | INTRAMUSCULAR | Status: DC | PRN

## 2021-04-30 MED ORDER — LACTATED RINGERS IV SOLN: 500.0000 mL | INTRAVENOUS | Status: DC | PRN

## 2021-04-30 MED ORDER — ONDANSETRON HCL 4 MG PO TABS: 4.0000 mg | ORAL_TABLET | ORAL | Status: DC | PRN

## 2021-04-30 MED ORDER — LABETALOL HCL 5 MG/ML IV SOLN: 40.0000 mg | INTRAVENOUS | Status: DC | PRN

## 2021-04-30 MED ORDER — ZOLPIDEM TARTRATE 5 MG PO TABS: 5.0000 mg | ORAL_TABLET | Freq: Every evening | ORAL | Status: DC | PRN

## 2021-04-30 MED ORDER — OXYTOCIN 10 UNIT/ML IJ SOLN: 10.0000 [IU] | Freq: Once | INTRAMUSCULAR | Status: DC | PRN

## 2021-04-30 MED ORDER — LABETALOL HCL 5 MG/ML IV SOLN: 80.0000 mg | INTRAVENOUS | Status: DC | PRN

## 2021-04-30 MED ORDER — PRENATAL MULTIVITAMIN CH
1.0000 | ORAL_TABLET | Freq: Every day | ORAL | Status: DC
  Administered 2021-05-01 – 2021-05-02 (×2): 1 via ORAL
  Filled 2021-04-30 (×2): qty 1

## 2021-04-30 MED ORDER — OXYCODONE HCL 5 MG PO TABS: 5.0000 mg | ORAL_TABLET | ORAL | Status: DC | PRN

## 2021-04-30 NOTE — MAU Note (Signed)
Pt finished Gatorade and 2nd cup of water. Up to bathroom to collect urine sample.

## 2021-04-30 NOTE — Anesthesia Preprocedure Evaluation (Signed)
Anesthesia Evaluation   Patient awake    Reviewed: Allergy & Precautions, NPO status , Patient's Chart, lab work & pertinent test results  Airway Mallampati: II  TM Distance: >3 FB Neck ROM: Full    Dental no notable dental hx. (+) Teeth Intact, Dental Advisory Given   Pulmonary    Pulmonary exam normal breath sounds clear to auscultation       Cardiovascular hypertension (Pre E), Pt. on medications Normal cardiovascular exam Rhythm:Regular Rate:Normal     Neuro/Psych  Headaches, negative psych ROS   GI/Hepatic negative GI ROS, Neg liver ROS,   Endo/Other  negative endocrine ROS  Renal/GU negative Renal ROS     Musculoskeletal   Abdominal   Peds  Hematology Lab Results      Component                Value               Date                            HGB                      11.2 (L)            04/30/2021                HCT                      34.7 (L)            04/30/2021                   PLT                      219                 04/30/2021              Anesthesia Other Findings   Reproductive/Obstetrics (+) Pregnancy                             Anesthesia Physical Anesthesia Plan  ASA: 3  Anesthesia Plan: Epidural   Post-op Pain Management:    Induction:   PONV Risk Score and Plan:   Airway Management Planned:   Additional Equipment:   Intra-op Plan:   Post-operative Plan:   Informed Consent: I have reviewed the patients History and Physical, chart, labs and discussed the procedure including the risks, benefits and alternatives for the proposed anesthesia with the patient or authorized representative who has indicated his/her understanding and acceptance.       Plan Discussed with:   Anesthesia Plan Comments: (39 wk G3P0 w PE for LEA)        Anesthesia Quick Evaluation

## 2021-04-30 NOTE — MAU Provider Note (Signed)
History     CSN: [id number]  Arrival date and time: 04/29/21 2311   Event Date/Time   First Provider Initiated Contact with Patient 04/30/21 0123      Chief Complaint  Patient presents with   Contractions   [name] is a 36 y.o. year old G29P0020 female at [redacted]w[redacted]d weeks gestation who presents to MAU reporting for labor check and found to have mildly elevated blood pressures. She has not had any issues with BP during pregnancy. She denies any VB, abnormal vaginal discharge, or LOF. She [facility] from [facility]; last appt was 04/25/2021. Her spouse is present and contributing to the history taking.   OB History     Gravida  3   Para  0   Term  0   Preterm  0   AB  2   Living  0      SAB  1   IAB  1   Ectopic  0   Multiple  0   Live Births  0           Past Medical History:  Diagnosis Date   Allergy    Headache    History of chlamydia    Infection    UTI   Seasonal allergies    Vaginal Pap smear, abnormal    colpo, ok since    Past Surgical History:  Procedure Laterality Date   COLONOSCOPY     colposcopy N/A     Family History  Problem Relation Age of Onset   Diabetes Father    Hypertension Maternal Grandmother    Diabetes Maternal Grandmother    Kidney failure Maternal Grandmother        dialysis   Asthma Maternal Grandmother    Kidney disease Maternal Grandmother    Stomach cancer Maternal Grandmother    Stroke Maternal Grandfather    Colon cancer Maternal Grandfather        mets   Birth defects Maternal Grandfather    Hypertension Mother    Seizures Mother    Breast cancer Other    Esophageal cancer Neg Hx    Rectal cancer Neg Hx     Social History   Tobacco Use   Smoking status: Former    Types: Cigarettes    Quit date: 04/27/2015    Years since quitting: 6.0   Smokeless tobacco: Never   Tobacco comments:    only tried for a wk  Vaping Use   Vaping Use: Never used  Substance Use Topics   Alcohol use:  Not Currently    Comment: ocassionally   Drug use: No    Allergies: No Known Allergies  Medications Prior to Admission  Medication Sig Dispense Refill Last Dose   Multiple Vitamin (MULTIVITAMIN) tablet Take 1 tablet by mouth daily.   04/29/2021   Prenatal Vit-Fe Fumarate-FA (MULTIVITAMIN-PRENATAL) 27-0.8 MG TABS tablet Take 1 tablet by mouth daily at 12 noon.   04/29/2021   dicyclomine (BENTYL) 20 MG tablet Take 1 tablet (20 mg total) by mouth 4 (four) times daily. 120 tablet 5    sucralfate (CARAFATE) 1 g tablet Take 1 tablet (1 g total) by mouth 4 (four) times daily -  with meals and at bedtime. 60 tablet 5     Review of Systems  Constitutional: Negative.   HENT: Negative.    Eyes: Negative.   Respiratory: Negative.    Cardiovascular: Negative.   Gastrointestinal: Negative.   Endocrine: Negative.   Genitourinary: Negative.  Musculoskeletal: Negative.   Skin: Negative.   Allergic/Immunologic: Negative.   Neurological: Negative.   Hematological: Negative.   Psychiatric/Behavioral: Negative.     Physical Exam   Patient Vitals for the past 24 hrs:  BP Temp Temp src Pulse Resp SpO2 Height Weight  04/30/21 0316 135/90 -- -- 79 -- -- -- --  04/30/21 0245 (!) 142/90 -- -- 72 -- 99 % -- --  04/30/21 0240 -- -- -- -- -- 100 % -- --  04/30/21 0231 (!) 136/94 -- -- (!) 57 18 -- -- --  04/30/21 0221 (!) 133/94 -- -- 67 -- -- -- --  04/30/21 0151 127/87 -- -- 71 -- -- -- --  04/30/21 0121 134/81 -- -- 60 18 -- -- --  04/30/21 0105 -- -- -- -- -- 97 % -- --  04/30/21 0100 -- -- -- -- -- 97 % -- --  04/30/21 0055 -- -- -- -- -- 97 % -- --  04/30/21 0050 -- -- -- -- -- 96 % -- --  04/30/21 0046 124/89 -- -- 81 18 -- -- --  04/30/21 0045 -- -- -- -- -- 95 % -- --  04/30/21 0040 -- -- -- -- -- 98 % -- --  04/30/21 0035 -- -- -- -- -- 98 % -- --  04/30/21 0005 -- -- -- -- -- 93 % -- --  04/30/21 0000 -- -- -- -- -- 96 % -- --  04/29/21 2351 -- -- -- -- -- 99 % -- --  04/29/21 2345  -- -- -- -- -- 98 % -- --  04/29/21 2342 137/88 -- -- 72 18 99 % -- --  04/29/21 2335 -- -- -- -- -- 98 % -- --  04/29/21 2333 128/90 97.9 F (36.6 C) Oral 84 17 99 % 5\' 6"  (1.676 m) 77.6 kg     Physical Exam Vitals and nursing note reviewed. Exam conducted with a chaperone present.  Constitutional:      Appearance: Normal appearance. She is obese.  Cardiovascular:     Rate and Rhythm: Normal rate.  Pulmonary:     Effort: Pulmonary effort is normal.  Abdominal:     Palpations: Abdomen is soft.  Genitourinary:    Comments: deferred Musculoskeletal:        General: Normal range of motion.  Skin:    General: Skin is warm and dry.  Neurological:     Mental Status: She is alert and oriented to person, place, and time.  Psychiatric:        Mood and Affect: Mood normal.        Behavior: Behavior normal.        Thought Content: Thought content normal.        Judgment: Judgment normal.   Dilation: 1.5 Effacement (%): 80 Cervical Position: Posterior Station: -2 Presentation: Vertex Exam by:: Raelyn Mora, CNM   MAU Course  Procedures  MDM CCUA CBC CMP P/C Ratio Serial BP's   *Consult with Dr. Donavan Foil @ [phone] - notified of patient's complaints, assessments, lab & NST results, tx plan admit for delivery - agrees with plan   *Consult with Dr. Jackelyn Knife @ [phone] - notified of patient's complaints, assessments, lab, NST results, and recommendation of admission for delivery - agrees with plan   Results for orders placed or performed during the hospital encounter of 04/29/21 (from the past 24 hour(s))  CBC     Status: Abnormal   Collection Time: 04/30/21  1:20 AM  Result Value  Ref Range   WBC 9.0 4.0 - 10.5 K/uL   RBC 3.70 (L) 3.87 - 5.11 MIL/uL   Hemoglobin 10.2 (L) 12.0 - 15.0 g/dL   HCT 19.1 (L) 47.8 - 29.5 %   MCV 88.1 80.0 - 100.0 fL   MCH 27.6 26.0 - 34.0 pg   MCHC 31.3 30.0 - 36.0 g/dL   RDW 62.1 30.8 - 65.7 %   Platelets 216 150 - 400 K/uL   nRBC 0.3 (H) 0.0 - 0.2 %   Comprehensive metabolic panel     Status: Abnormal   Collection Time: 04/30/21  1:20 AM  Result Value Ref Range   Sodium 134 (L) 135 - 145 mmol/L   Potassium 4.0 3.5 - 5.1 mmol/L   Chloride 107 98 - 111 mmol/L   CO2 18 (L) 22 - 32 mmol/L   Glucose, Bld 86 70 - 99 mg/dL   BUN 12 6 - 20 mg/dL   Creatinine, Ser 8.46 0.44 - 1.00 mg/dL   Calcium 9.5 8.9 - 96.2 mg/dL   Total Protein 5.7 (L) 6.5 - 8.1 g/dL   Albumin 2.5 (L) 3.5 - 5.0 g/dL   AST 27 15 - 41 U/L   ALT 24 0 - 44 U/L   Alkaline Phosphatase 151 (H) 38 - 126 U/L   Total Bilirubin 0.6 0.3 - 1.2 mg/dL   GFR, Estimated >95 >28 mL/min   Anion gap 9 5 - 15  Protein / creatinine ratio, urine     Status: Abnormal   Collection Time: 04/30/21  2:03 AM  Result Value Ref Range   Creatinine, Urine 159.27 mg/dL   Total Protein, Urine 69 mg/dL   Protein Creatinine Ratio 0.43 (H) 0.00 - 0.15 mg/mg[Cre]      Assessment and Plan  1. Preeclampsia, third trimester 2. [redacted] weeks gestation of pregnancy  - Admit to L&D  - Routine admission orders - Dr. Jackelyn Knife assumes care of patient at the time of admission  Raelyn Mora, CNM 04/30/2021, 1:23 AM

## 2021-04-30 NOTE — Progress Notes (Signed)
Patient ambulated to bathroom with RN Patient voided on toilet Patient attempted to stand and stated she felt dizzy  Patient sat back down on the toilet and had a syncopal episode. Additional staff was called to room and assisted patient back to bed with a stedy Once in bed, patient stated she felt a lot better and not dizzy any more.  BP 115/74 Bleeding WNL  Meisinger MD notified, 250cc LR bolus ordered  Nursing team will continue to monitor   Patient currently sitting in bed eating dinner.

## 2021-04-30 NOTE — H&P (Signed)
Beverly Stewart is a 36 y.o. female, G3 P0020, EGA [redacted] weeks with EDC 2-18 presenting for ctx.  She was seen in MAU early this am for ctx.  No cervical change and ctx spaced out.  However, she had some mildly elevated BP, elevated UPC, so admitted for labor augmentation.  PNC uncomplicated, on baby ASA.  OB History     Gravida  3   Para  0   Term  0   Preterm  0   AB  2   Living  0      SAB  1   IAB  1   Ectopic  0   Multiple  0   Live Births  0          Past Medical History:  Diagnosis Date   Allergy    Headache    History of chlamydia    Infection    UTI   Seasonal allergies    Vaginal Pap smear, abnormal    colpo, ok since   Past Surgical History:  Procedure Laterality Date   COLONOSCOPY     colposcopy N/A    Family History: family history includes Asthma in her maternal grandmother; Birth defects in her maternal grandfather; Breast cancer in an other family member; Colon cancer in her maternal grandfather; Diabetes in her father and maternal grandmother; Hypertension in her maternal grandmother and mother; Kidney disease in her maternal grandmother; Kidney failure in her maternal grandmother; Seizures in her mother; Stomach cancer in her maternal grandmother; Stroke in her maternal grandfather. Social History:  reports that she quit smoking about 6 years ago. Her smoking use included cigarettes. She has never used smokeless tobacco. She reports that she does not currently use alcohol. She reports that she does not use drugs.     Maternal Diabetes: No Genetic Screening: Normal Maternal Ultrasounds/Referrals: Normal Fetal Ultrasounds or other Referrals:  None Maternal Substance Abuse:  No Significant Maternal Medications:  None Significant Maternal Lab Results:  Group B Strep negative Other Comments:  None  Review of Systems  Respiratory: Negative.    Cardiovascular: Negative.   Maternal Medical History:  Reason for admission: Contractions.    Contractions: Frequency: irregular.   Perceived severity is moderate.   Fetal activity: Perceived fetal activity is normal.   Prenatal complications: no prenatal complications Prenatal Complications - Diabetes: none.  Dilation: 2 Effacement (%): 80 Station: -1, 0 Exam by:: Carlyn Reichert, RN Blood pressure 127/85, pulse 81, temperature 98 F (36.7 C), temperature source Oral, resp. rate 16, height 5\' 6"  (1.676 m), weight 77.6 kg, SpO2 99 %, unknown if currently breastfeeding. Maternal Exam:  Uterine Assessment: Contraction strength is moderate.  Contraction frequency is irregular.  Abdomen: Patient reports no abdominal tenderness. Estimated fetal weight is 7 lbs.   Fetal presentation: vertex Introitus: Normal vulva. Normal vagina.  Amniotic fluid character: not assessed. Pelvis: adequate for delivery.     Fetal Exam Fetal Monitor Review: Mode: ultrasound.   Baseline rate: 120.  Variability: moderate (6-25 bpm).   Pattern: accelerations present and no decelerations.   Fetal State Assessment: Category I - tracings are normal.  Physical Exam Vitals reviewed.  Constitutional:      Appearance: Normal appearance.  Cardiovascular:     Rate and Rhythm: Normal rate and regular rhythm.  Pulmonary:     Effort: Pulmonary effort is normal. No respiratory distress.  Abdominal:     Palpations: Abdomen is soft.  Genitourinary:    General: Normal vulva.  Neurological:  Mental Status: She is alert.    Prenatal labs: ABO, Rh: --/--/O POS (02/12 3007) Antibody: NEG (02/12 0336) Rubella: Immune (08/02 0000) RPR: Nonreactive (08/02 0000)  HBsAg: Negative (08/02 0000)  HIV: Non-reactive (08/02 0000)  GBS: Negative/-- (01/31 0000)   Assessment/Plan: IUP at 39 weeks, latent labor, preeclampsia without severe features.  Admitted for labor augmentation, will monitor progress, consider AROM when more dilated, monitor and treat BP prn   Leighton Roach Toriana Sponsel 04/30/2021, 10:01 AM

## 2021-04-30 NOTE — Progress Notes (Signed)
Comfortable with epidural Afeb, VAA, BP ok FHT-120s, Cat I, ctx q 3 min VE-7/80/0, vtx, AROM clear Continue pitocin, monitor progress, anticipate SVD

## 2021-04-30 NOTE — MAU Note (Signed)
Pt states that the contractions have "died down". Will perform  SVE at 0100.

## 2021-04-30 NOTE — MAU Note (Signed)
Pt reports contractions returning in frequency and she is feeling more constant vaginal - rectal pressure with them

## 2021-04-30 NOTE — Anesthesia Procedure Notes (Signed)
Epidural Patient location during procedure: OB Start time: 04/30/2021 11:57 AM End time: 04/30/2021 12:17 PM  Staffing Anesthesiologist: Trevor Iha, MD Performed: anesthesiologist   Preanesthetic Checklist Completed: patient identified, IV checked, site marked, risks and benefits discussed, surgical consent, monitors and equipment checked, pre-op evaluation and timeout performed  Epidural Patient position: sitting Prep: DuraPrep and site prepped and draped Patient monitoring: continuous pulse ox and blood pressure Approach: midline Location: L3-L4 Injection technique: LOR air  Needle:  Needle type: Tuohy  Needle gauge: 17 G Needle length: 9 cm and 9 Needle insertion depth: 7 cm Catheter type: closed end flexible Catheter size: 19 Gauge Catheter at skin depth: 12 cm Test dose: negative  Assessment Events: blood not aspirated, injection not painful, no injection resistance, no paresthesia and negative IV test  Additional Notes Patient identified. Risks/Benefits/Options discussed with patient including but not limited to bleeding, infection, nerve damage, paralysis, failed block, incomplete pain control, headache, blood pressure changes, nausea, vomiting, reactions to medication both or allergic, itching and postpartum back pain. Confirmed with bedside nurse the patient's most recent platelet count. Confirmed with patient that they are not currently taking any anticoagulation, have any bleeding history or any family history of bleeding disorders. Patient expressed understanding and wished to proceed. All questions were answered. Sterile technique was used throughout the entire procedure. Please see nursing notes for vital signs. Test dose was given through epidural needle and negative prior to continuing to dose epidural or start infusion. Warning signs of high block given to the patient including shortness of breath, tingling/numbness in hands, complete motor block, or any  concerning symptoms with instructions to call for help. Patient was given instructions on fall risk and not to get out of bed. All questions and concerns addressed with instructions to call with any issues.  1 Attempt (S) . Patient tolerated procedure well.

## 2021-05-01 NOTE — Anesthesia Postprocedure Evaluation (Signed)
Anesthesia Post Note  Patient: Beverly Stewart  Procedure(s) Performed: AN AD HOC LABOR EPIDURAL     Patient location during evaluation: Mother Baby Anesthesia Type: Epidural Level of consciousness: awake Pain management: satisfactory to patient Vital Signs Assessment: post-procedure vital signs reviewed and stable Respiratory status: spontaneous breathing Cardiovascular status: stable Anesthetic complications: no   No notable events documented.  Last Vitals:  Vitals:   05/01/21 0051 05/01/21 0600  BP: 134/87 136/89  Pulse: 93   Resp: 18 18  Temp: 37.1 C 36.7 C  SpO2: 99% 100%    Last Pain:  Vitals:   05/01/21 0600  TempSrc: Oral  PainSc:    Pain Goal: Patients Stated Pain Goal: 1 (04/30/21 1000)                 Cephus Shelling

## 2021-05-01 NOTE — Progress Notes (Signed)
POSTPARTUM PROGRESS NOTE  Post Partum Day #1  Subjective:  No acute events overnight.  Pt denies problems with ambulating, voiding or po intake.  She denies nausea or vomiting.  Pain is well controlled.  Lochia Minimal. Denies PreE symptoms this morning  Objective: Blood pressure 136/89, pulse 93, temperature 98 F (36.7 C), temperature source Oral, resp. rate 18, height 5\' 6"  (1.676 m), weight 77.6 kg, SpO2 100 %, unknown if currently breastfeeding.  Physical Exam:  General: alert, cooperative and no distress Lochia:normal flow Chest: CTAB Heart: RRR no m/r/g Abdomen: +BS, soft, nontender Uterine Fundus: firm, 2cm below umbilicus GU: suture intact, healing well, no purulent drainage Extremities: neg edema, neg calf TTP BL, neg Homans BL  Recent Labs    04/30/21 0120 04/30/21 0836  HGB 10.2* 11.2*  HCT 32.6* 34.7*    Assessment/Plan:  ASSESSMENT: Beverly Stewart is a 36 y.o. 20 s/p VAVD (maternal exhaustion) @ [redacted]w[redacted]d. Was IOL for PreE w/o SF.   Plan for discharge tomorrow Trend BP today, treat as necessary, plan for 1wk BP check pp   LOS: 1 day

## 2021-05-02 ENCOUNTER — Inpatient Hospital Stay (HOSPITAL_COMMUNITY): Payer: 59

## 2021-05-02 MED ORDER — ACETAMINOPHEN 325 MG PO TABS: 650.0000 mg | ORAL_TABLET | Freq: Four times a day (QID) | ORAL | Status: AC | PRN

## 2021-05-02 MED ORDER — IBUPROFEN 200 MG PO TABS: 600.0000 mg | ORAL_TABLET | Freq: Four times a day (QID) | ORAL | Status: DC | PRN

## 2021-05-02 NOTE — Progress Notes (Signed)
Post Partum Day 2 Subjective: Sheldon is doing well this morning. No complaints. Pain controlled. Ambulating, voiding, tolerating PO. Minimal lochia. No HA, VC, RUQ pain.   Objective: Patient Vitals for the past 24 hrs:  BP Temp Temp src Pulse Resp SpO2  05/02/21 0541 125/87 98.7 F (37.1 C) Oral 70 18 --  05/01/21 2158 127/83 98.6 F (37 C) Oral 68 17 99 %  05/01/21 1447 121/79 97.9 F (36.6 C) Oral 64 20 98 %    Physical Exam:  General: alert, cooperative, and no distress, ambulating in room Lochia: appropriate Uterine Fundus: firm DVT Evaluation: No evidence of DVT seen on physical exam.  Recent Labs    04/30/21 0120 04/30/21 0836  WBC 9.0 9.3  HGB 10.2* 11.2*  HCT 32.6* 34.7*  PLT 216 219    Recent Labs    04/30/21 0120  NA 134*  K 4.0  CL 107  BUN 12  CREATININE 0.88  GLUCOSE 86  BILITOT 0.6  ALT 24  AST 27  ALKPHOS 151*  PROT 5.7*  ALBUMIN 2.5*    Recent Labs    04/30/21 0120  CALCIUM 9.5    No results for input(s): PROTIME, APTT, INR in the last 72 hours.  No results for input(s): PROTIME, APTT, INR, FIBRINOGEN in the last 72 hours. Assessment/Plan:  Charice Zuno 36 y.o. G3P1021 PPD#2 sp VAVD 1. PPC: continue routine PP care 2. Preeclampsia: normotensive since delivery, asymptomatic, will plan PP BP check 3. Rh pos, rubella immune. S/p tdap prenatally 4. Dispo: stable for discharge home. Instructions reviewed.   LOS: 2 days   Charlett Nose 05/02/2021, 9:33 AM

## 2021-05-02 NOTE — Discharge Summary (Signed)
Postpartum Discharge Summary  Date of Service updated 05/02/21      Patient Name: Beverly Stewart DOB: 1985/04/07 MRN: [id number]  Date of admission: 04/29/2021 Delivery date:04/30/2021  Delivering provider: Willis Modena, TODD  Date of discharge: 05/02/2021  Admitting diagnosis: Preeclampsia, third trimester [O14.93] Intrauterine pregnancy: [redacted]w[redacted]d    Secondary diagnosis:  Principal Problem:   Preeclampsia, third trimester Active Problems:   SVD (spontaneous vaginal delivery)  Additional problems: none    Discharge diagnosis: Term Pregnancy Delivered and Preeclampsia (mild)                                              Post partum procedures: none Augmentation: Pitocin Complications: None  Hospital course: Onset of Labor With Vaginal Delivery      36y.o. yo [id number] admitted in Latent Labor on 04/29/2021 with blood pressure elevation. Augmentation for preeclampsia. Patient had an uncomplicated labor course as follows:  Membrane Rupture Time/Date: 1:41 PM ,04/30/2021   Delivery Method:Vaginal, Vacuum (Extractor)  Episiotomy: None  Lacerations:  2nd degree  Patient had an uncomplicated postpartum course.  She is ambulating, tolerating a regular diet, passing flatus, and urinating well. Patient is discharged home in stable condition on 05/02/21.  Newborn Data: Birth date:04/30/2021  Birth time:5:15 PM  Gender:Female  Living status:Living  Apgars:8 ,9  Weight:3657 g   Magnesium Sulfate received: No BMZ received: No Rhophylac:N/A MMR:N/A T-DaP:Given prenatally Flu: No Transfusion:No  Physical exam  Vitals:   05/01/21 0901 05/01/21 1447 05/01/21 2158 05/02/21 0541  BP: 128/86 121/79 127/83 125/87  Pulse: 68 64 68 70  Resp: _0 Temp: 98.3 F (36.8 C) 97.9 F (36.6 C) 98.6 F (37 C) 98.7 F (37.1 C)  TempSrc: Oral Oral Oral Oral  SpO2: 100% 98% 99%   Weight:      Height:       General: alert, cooperative, and no distress Lochia:  appropriate Uterine Fundus: firm Incision: N/A DVT Evaluation: No evidence of DVT seen on physical exam. Labs: Lab Results  Component Value Date   WBC 9.3 04/30/2021   HGB 11.2 (L) 04/30/2021   HCT 34.7 (L) 04/30/2021   MCV 87.8 04/30/2021   PLT 219 04/30/2021   CMP Latest Ref Rng & Units 04/30/2021  Glucose 70 - 99 mg/dL 86  BUN 6 - 20 mg/dL 12  Creatinine 0.44 - 1.00 mg/dL 0.88  Sodium 135 - 145 mmol/L 134(L)  Potassium 3.5 - 5.1 mmol/L 4.0  Chloride 98 - 111 mmol/L 107  CO2 22 - 32 mmol/L 18(L)  Calcium 8.9 - 10.3 mg/dL 9.5  Total Protein 6.5 - 8.1 g/dL 5.7(L)  Total Bilirubin 0.3 - 1.2 mg/dL 0.6  Alkaline Phos 38 - 126 U/L 151(H)  AST 15 - 41 U/L 27  ALT 0 - 44 U/L 24   Edinburgh Score: Edinburgh Postnatal Depression Scale Screening Tool 05/01/2021  I have been able to laugh and see the funny side of things. 0  I have looked forward with enjoyment to things. 0  I have blamed myself unnecessarily when things went wrong. 1  I have been anxious or worried for no good reason. 0  I have felt scared or panicky for no good reason. 1  Things have been getting on top of me. 2  I have been so unhappy that I have had difficulty  sleeping. 1  I have felt sad or miserable. 1  I have been so unhappy that I have been crying. 1  The thought of harming myself has occurred to me. 0  Edinburgh Postnatal Depression Scale Total 7      After visit meds:  Allergies as of 05/02/2021   No Known Allergies      Medication List     STOP taking these medications    aspirin EC 81 MG tablet   dicyclomine 20 MG tablet Commonly known as: BENTYL       TAKE these medications    acetaminophen 325 MG tablet Commonly known as: Tylenol Take 2 tablets (650 mg total) by mouth every 6 (six) hours as needed (for pain scale < 4).   ibuprofen 200 MG tablet Commonly known as: ADVIL Take 3 tablets (600 mg total) by mouth every 6 (six) hours as needed.   multivitamin tablet Take 1 tablet  by mouth daily.   multivitamin-prenatal 27-0.8 MG Tabs tablet Take 1 tablet by mouth daily at 12 noon.   sucralfate 1 g tablet Commonly known as: Carafate Take 1 tablet (1 g total) by mouth 4 (four) times daily -  with meals and at bedtime.         Discharge home in stable condition Infant Feeding: Bottle Infant Disposition:home with mother Discharge instruction: per After Visit Summary and Postpartum booklet. Activity: Advance as tolerated. Pelvic rest for 6 weeks.  Diet: low salt diet Anticipated Birth Control: Unsure Postpartum Appointment:4 weeks Additional Postpartum F/U: BP check 1 week Future Appointments:No future appointments. Follow up Visit:  Follow-up Information     Ob/Gyn, [facility]. Schedule an appointment as soon as possible for a visit in 1 week(s).   Why: Blood pressure check Contact information: [place] [address] [phone]                     05/02/2021 Rowland Lathe, MD

## 2021-05-10 ENCOUNTER — Telehealth (HOSPITAL_COMMUNITY): Payer: Self-pay | Admitting: *Deleted

## 2021-05-10 NOTE — Telephone Encounter (Signed)
Mom reports feeling good. No concerns about herself at this time. EPDS=9(Hospital score=7) Mom reports baby is doing well. Feeding, peeing, and pooping without difficulty. Safe sleep reviewed. Mom reports no concerns about baby at present.  Odis Hollingshead, RN 05-08-2021 at 9:51am

## 2021-09-17 ENCOUNTER — Ambulatory Visit
Admission: EM | Admit: 2021-09-17 | Discharge: 2021-09-17 | Disposition: A | Discharge status: Home / Self Care | Payer: 59 | Attending: Emergency Medicine | Admitting: Emergency Medicine

## 2021-09-17 DIAGNOSIS — R519 Headache, unspecified: Secondary | ICD-10-CM | POA: Diagnosis not present

## 2021-09-17 DIAGNOSIS — J309 Allergic rhinitis, unspecified: Secondary | ICD-10-CM

## 2021-09-17 MED ORDER — FEXOFENADINE HCL 180 MG PO TABS
180.0000 mg | ORAL_TABLET | Freq: Every day | ORAL | 1 refills | Status: DC
Start: 2021-09-17 — End: 2022-10-21

## 2021-09-17 MED ORDER — FLUTICASONE PROPIONATE 50 MCG/ACT NA SUSP: 1.0000 | Freq: Every day | NASAL | 2 refills | Status: AC

## 2021-09-17 MED ORDER — IBUPROFEN 600 MG PO TABS: 600.0000 mg | ORAL_TABLET | Freq: Three times a day (TID) | ORAL | 0 refills | Status: DC | PRN

## 2021-09-17 MED ORDER — KETOROLAC TROMETHAMINE 30 MG/ML IJ SOLN
30.0000 mg | Freq: Once | INTRAMUSCULAR | Status: AC
  Administered 2021-09-17: 30 mg via INTRAMUSCULAR

## 2021-09-17 NOTE — ED Provider Notes (Signed)
[facility]    CSN: [id number] Arrival date & time: 09/17/21  0848    HISTORY   Chief Complaint  Patient presents with   Headache   Dizziness   HPI Beverly Stewart is a 36 y.o. female. Pt c/o severe headaches that began last Thursday, she states she has chill, light sensitivity,dizziness and flushing.  Home interventions: tylenol sinus and headache, and extra strength nasal spray that she does not recall the name of.  Patient has normal vital signs on arrival, patient is wearing an ice pack on her head as I walk into the room however she is smiling, calm, interactive and in no acute distress.  The history is provided by the patient.   Past Medical History:  Diagnosis Date   Allergy    Headache    History of chlamydia    Infection    UTI   Seasonal allergies    Vaginal Pap smear, abnormal    colpo, ok since   Patient Active Problem List   Diagnosis Date Noted   Preeclampsia, third trimester 04/30/2021   SVD (spontaneous vaginal delivery) 04/30/2021   CIN I (cervical intraepithelial neoplasia I) 07/05/2016   Past Surgical History:  Procedure Laterality Date   COLONOSCOPY     colposcopy N/A    OB History     Gravida  3   Para  1   Term  1   Preterm  0   AB  2   Living  1      SAB  1   IAB  1   Ectopic  0   Multiple  0   Live Births  1          Home Medications    Prior to Admission medications   Medication Sig Start Date End Date Taking? Authorizing Provider  acetaminophen (TYLENOL) 325 MG tablet Take 2 tablets (650 mg total) by mouth every 6 (six) hours as needed (for pain scale < 4). 05/02/21   Charlett Nose, MD  ibuprofen (ADVIL) 200 MG tablet Take 3 tablets (600 mg total) by mouth every 6 (six) hours as needed. 05/02/21   Charlett Nose, MD  Multiple Vitamin (MULTIVITAMIN) tablet Take 1 tablet by mouth daily.    [provider]  Prenatal Vit-Fe Fumarate-FA (MULTIVITAMIN-PRENATAL) 27-0.8 MG TABS tablet Take 1  tablet by mouth daily at 12 noon.    [provider]  sucralfate (CARAFATE) 1 g tablet Take 1 tablet (1 g total) by mouth 4 (four) times daily -  with meals and at bedtime. 04/02/19   Unk Lightning, PA   Family History Family History  Problem Relation Age of Onset   Diabetes Father    Hypertension Maternal Grandmother    Diabetes Maternal Grandmother    Kidney failure Maternal Grandmother        dialysis   Asthma Maternal Grandmother    Kidney disease Maternal Grandmother    Stomach cancer Maternal Grandmother    Stroke Maternal Grandfather    Colon cancer Maternal Grandfather        mets   Birth defects Maternal Grandfather    Hypertension Mother    Seizures Mother    Breast cancer Other    Esophageal cancer Neg Hx    Rectal cancer Neg Hx    Social History Social History   Tobacco Use   Smoking status: Former    Types: Cigarettes    Quit date: 04/27/2015    Years since quitting: 6.3  Smokeless tobacco: Never   Tobacco comments:    only tried for a wk  Vaping Use   Vaping Use: Never used  Substance Use Topics   Alcohol use: Not Currently    Comment: ocassionally   Drug use: No   Allergies   Patient has no known allergies.  Review of Systems Review of Systems Pertinent findings noted in history of present illness.   Physical Exam Triage Vital Signs ED Triage Vitals  Enc Vitals Group     BP 01/13/21 0827 (!) 147/82     Pulse Rate 01/13/21 0827 72     Resp 01/13/21 0827 18     Temp 01/13/21 0827 98.3 F (36.8 C)     Temp Source 01/13/21 0827 Oral     SpO2 01/13/21 0827 98 %     Weight --      Height --      Head Circumference --      Peak Flow --      Pain Score 01/13/21 0826 5     Pain Loc --      Pain Edu? --      Excl. in [place]? --   No data found.  Updated Vital Signs BP 107/77 (BP Location: Right Arm)   Pulse 76   Temp 98.9 F (37.2 C) (Oral)   Resp 16   LMP 08/27/2021 (Approximate)   SpO2 96%   Breastfeeding No   Physical  Exam Vitals and nursing note reviewed.  Constitutional:      General: She is not in acute distress.    Appearance: Normal appearance. She is not ill-appearing.  HENT:     Head: Normocephalic and atraumatic.     Salivary Glands: Right salivary gland is not diffusely enlarged or tender. Left salivary gland is not diffusely enlarged or tender.     Right Ear: Ear canal and external ear normal. No drainage. A middle ear effusion is present. There is no impacted cerumen. Tympanic membrane is bulging. Tympanic membrane is not injected or erythematous.     Left Ear: Ear canal and external ear normal. No drainage. A middle ear effusion is present. There is no impacted cerumen. Tympanic membrane is bulging. Tympanic membrane is not injected or erythematous.     Ears:     Comments: Bilateral EACs normal, both TMs bulging with clear fluid    Nose: Rhinorrhea present. No nasal deformity, septal deviation, signs of injury, nasal tenderness, mucosal edema or congestion. Rhinorrhea is clear.     Right Nostril: Occlusion present. No foreign body, epistaxis or septal hematoma.     Left Nostril: Occlusion present. No foreign body, epistaxis or septal hematoma.     Right Turbinates: Enlarged, swollen and pale.     Left Turbinates: Enlarged, swollen and pale.     Right Sinus: No maxillary sinus tenderness or frontal sinus tenderness.     Left Sinus: No maxillary sinus tenderness or frontal sinus tenderness.     Mouth/Throat:     Lips: Pink. No lesions.     Mouth: Mucous membranes are moist. No oral lesions.     Pharynx: Oropharynx is clear. Uvula midline. No posterior oropharyngeal erythema or uvula swelling.     Tonsils: No tonsillar exudate. 0 on the right. 0 on the left.     Comments: Postnasal drip Eyes:     General: Lids are normal.        Right eye: No discharge.        Left eye: No  discharge.     Extraocular Movements: Extraocular movements intact.     Conjunctiva/sclera: Conjunctivae normal.      Right eye: Right conjunctiva is not injected.     Left eye: Left conjunctiva is not injected.  Neck:     Trachea: Trachea and phonation normal.  Cardiovascular:     Rate and Rhythm: Normal rate and regular rhythm.     Pulses: Normal pulses.     Heart sounds: Normal heart sounds. No murmur heard.    No friction rub. No gallop.  Pulmonary:     Effort: Pulmonary effort is normal. No accessory muscle usage, prolonged expiration or respiratory distress.     Breath sounds: Normal breath sounds. No stridor, decreased air movement or transmitted upper airway sounds. No decreased breath sounds, wheezing, rhonchi or rales.  Chest:     Chest wall: No tenderness.  Musculoskeletal:        General: Normal range of motion.     Cervical back: Normal range of motion and neck supple. Normal range of motion.  Lymphadenopathy:     Cervical: No cervical adenopathy.  Skin:    General: Skin is warm and dry.     Findings: No erythema or rash.  Neurological:     General: No focal deficit present.     Mental Status: She is alert and oriented to person, place, and time.  Psychiatric:        Mood and Affect: Mood normal.        Behavior: Behavior normal.     Visual Acuity Right Eye Distance:   Left Eye Distance:   Bilateral Distance:    Right Eye Near:   Left Eye Near:    Bilateral Near:     UC Couse / Diagnostics / Procedures:    EKG  Radiology No results found.  Procedures Procedures (including critical care time)  UC Diagnoses / Final Clinical Impressions(s)   I have reviewed the triage vital signs and the nursing notes.  Pertinent labs & imaging results that were available during my care of the patient were reviewed by me and considered in my medical decision making (see chart for details).   Final diagnoses:  Acute intractable headache, unspecified headache type  Allergic rhinitis, unspecified seasonality, unspecified trigger   COVID test result pending.  Patient provided with  ketorolac for her headache and advised to begin Allegra and Flonase for allergies.  Patient also provided with ibuprofen 600 mg for further management of her headache pain should return.  Return precautions advised.  ED Prescriptions     Medication Sig Dispense Auth. Provider   fexofenadine (ALLEGRA) 180 MG tablet Take 1 tablet (180 mg total) by mouth daily. 90 tablet Lynden Oxford Scales, PA-C   fluticasone (FLONASE) 50 MCG/ACT nasal spray Place 1 spray into both nostrils daily. Begin by using 2 sprays in each nare daily for 3 to 5 days, then decrease to 1 spray in each nare daily. 15.8 mL Lynden Oxford Scales, PA-C   ibuprofen (ADVIL) 600 MG tablet Take 1 tablet (600 mg total) by mouth every 8 (eight) hours as needed for up to 30 doses for fever, headache, mild pain or moderate pain (Inflammation). Take 1 tablet 3 times daily as needed for inflammation of upper airways and/or pain. 30 tablet Lynden Oxford Scales, PA-C      PDMP not reviewed this encounter.  Pending results:  Labs Reviewed  NOVEL CORONAVIRUS, NAA    Medications Ordered in UC: Medications  ketorolac (TORADOL) 30  MG/ML injection 30 mg (30 mg Intramuscular Given 09/17/21 1053)    Disposition Upon Discharge:  Condition: stable for discharge home Home: take medications as prescribed; routine discharge instructions as discussed; follow up as advised.  Patient presented with an acute illness with associated systemic symptoms and significant discomfort requiring urgent management. In my opinion, this is a condition that a prudent lay person (someone who possesses an average knowledge of health and medicine) may potentially expect to result in complications if not addressed urgently such as respiratory distress, impairment of bodily function or dysfunction of bodily organs.   Routine symptom specific, illness specific and/or disease specific instructions were discussed with the patient and/or caregiver at length.   As  such, the patient has been evaluated and assessed, work-up was performed and treatment was provided in alignment with urgent care protocols and evidence based medicine.  Patient/parent/caregiver has been advised that the patient may require follow up for further testing and treatment if the symptoms continue in spite of treatment, as clinically indicated and appropriate.  If the patient was tested for COVID-19, Influenza and/or RSV, then the patient/parent/guardian was advised to isolate at home pending the results of his/her diagnostic coronavirus test and potentially longer if they're positive. I have also advised pt that if his/her COVID-19 test returns positive, it's recommended to self-isolate for at least 10 days after symptoms first appeared AND until fever-free for 24 hours without fever reducer AND other symptoms have improved or resolved. Discussed self-isolation recommendations as well as instructions for household member/close contacts as per the [facility] and [place] DHHS, and also gave patient the COVID packet with this information.  Patient/parent/caregiver has been advised to return to the [facility] or PCP in 3-5 days if no better; to PCP or the Emergency Department if new signs and symptoms develop, or if the current signs or symptoms continue to change or worsen for further workup, evaluation and treatment as clinically indicated and appropriate  The patient will follow up with their current PCP if and as advised. If the patient does not currently have a PCP we will assist them in obtaining one.   The patient may need specialty follow up if the symptoms continue, in spite of conservative treatment and management, for further workup, evaluation, consultation and treatment as clinically indicated and appropriate.  Patient/parent/caregiver verbalized understanding and agreement of plan as discussed.  All questions were addressed during visit.  Please see discharge instructions below for further details of  plan.  Discharge Instructions:   Discharge Instructions      Your symptoms and physical exam findings are concerning for a viral respiratory infection.     You were tested for COVID-19 today.  The result of your COVID-19 test will be posted to your MyChart once it is complete, typically this takes 36 to 48 hours.    Please see the list below for recommended medications, dosages and frequencies to provide relief of your current symptoms:    During your visit today, you received an injection of ketorolac, high-dose nonsteroidal anti-inflammatory pain medication that should significantly reduce your pain for the next 6 to 8 hours.  Allegra (fexofenadine): This is an excellent second-generation antihistamine that helps to reduce respiratory inflammatory response to environmental allergens.  This medication is not known to cause daytime sleepiness so it can be taken in the daytime.  If you find that it does make you sleepy, please feel free to take it at bedtime.   Flonase (fluticasone): This is a steroid nasal  spray that you use once daily, 1 spray in each nare.  This medication does not work well if you decide to use it only used as you feel you need to, it works best used on a daily basis.  After 3 to 5 days of use, you will notice significant reduction of the inflammation and mucus production that is currently being caused by exposure to allergens, whether seasonal or environmental.  The most common side effect of this medication is nosebleeds.  If you experience a nosebleed, please discontinue use for 1 week, then feel free to resume.  I have provided you with a prescription.     Advil, Motrin (ibuprofen): This is a good anti-inflammatory medication which addresses aches, pains and inflammation of the upper airways that causes sinus and nasal congestion as well as in the lower airways which makes your cough feel tight and sometimes burn.  I provided you with a prescription for ibuprofen 600 mg  that you can take 3 times daily as needed.    Tylenol (acetaminophen): This is a good fever reducer.  If your body temperature rises above 101.5 as measured with a thermometer, it is recommended that you take 1,000 mg every 8 hours until your temperature falls below 101.5, please not take more than 3,000 mg of acetaminophen either as a separate medication or as in ingredient in an over-the-counter cold/flu preparation within a 24-hour period.     Please follow-up within the next 5-7 days either with your primary care provider or urgent care if your symptoms do not resolve.  If you do not have a primary care provider, we will assist you in finding one.   Thank you for visiting urgent care today.  We appreciate the opportunity to participate in your care.       This office note has been dictated using Teaching laboratory technician.  Unfortunately, and despite my best efforts, this method of dictation can sometimes lead to occasional typographical or grammatical errors.  I apologize in advance if this occurs.     Theadora Rama Scales, PA-C 09/17/21 1243

## 2021-09-17 NOTE — Discharge Instructions (Signed)
Your symptoms and physical exam findings are concerning for a viral respiratory infection.     You were tested for COVID-19 today.  The result of your COVID-19 test will be posted to your MyChart once it is complete, typically this takes 36 to 48 hours.    Please see the list below for recommended medications, dosages and frequencies to provide relief of your current symptoms:    During your visit today, you received an injection of ketorolac, high-dose nonsteroidal anti-inflammatory pain medication that should significantly reduce your pain for the next 6 to 8 hours.  Allegra (fexofenadine): This is an excellent second-generation antihistamine that helps to reduce respiratory inflammatory response to environmental allergens.  This medication is not known to cause daytime sleepiness so it can be taken in the daytime.  If you find that it does make you sleepy, please feel free to take it at bedtime.   Flonase (fluticasone): This is a steroid nasal spray that you use once daily, 1 spray in each nare.  This medication does not work well if you decide to use it only used as you feel you need to, it works best used on a daily basis.  After 3 to 5 days of use, you will notice significant reduction of the inflammation and mucus production that is currently being caused by exposure to allergens, whether seasonal or environmental.  The most common side effect of this medication is nosebleeds.  If you experience a nosebleed, please discontinue use for 1 week, then feel free to resume.  I have provided you with a prescription.     Advil, Motrin (ibuprofen): This is a good anti-inflammatory medication which addresses aches, pains and inflammation of the upper airways that causes sinus and nasal congestion as well as in the lower airways which makes your cough feel tight and sometimes burn.  I provided you with a prescription for ibuprofen 600 mg that you can take 3 times daily as needed.    Tylenol  (acetaminophen): This is a good fever reducer.  If your body temperature rises above 101.5 as measured with a thermometer, it is recommended that you take 1,000 mg every 8 hours until your temperature falls below 101.5, please not take more than 3,000 mg of acetaminophen either as a separate medication or as in ingredient in an over-the-counter cold/flu preparation within a 24-hour period.     Please follow-up within the next 5-7 days either with your primary care provider or urgent care if your symptoms do not resolve.  If you do not have a primary care provider, we will assist you in finding one.   Thank you for visiting urgent care today.  We appreciate the opportunity to participate in your care.

## 2021-09-17 NOTE — ED Triage Notes (Signed)
Pt c/o severe headaches that began last Thursday, she states she has chill, light sensitivity,dizziness, and flushed feeling to fae.  Home interventions: tylenol sinus and headache, nasal spray

## 2021-09-18 LAB — NOVEL CORONAVIRUS, NAA: SARS-CoV-2, NAA: NOT DETECTED

## 2022-03-06 ENCOUNTER — Ambulatory Visit
Admission: EM | Admit: 2022-03-06 | Discharge: 2022-03-06 | Disposition: A | Discharge status: Home / Self Care | Payer: 59 | Attending: Internal Medicine | Admitting: Internal Medicine

## 2022-03-06 DIAGNOSIS — R350 Frequency of micturition: Secondary | ICD-10-CM | POA: Insufficient documentation

## 2022-03-06 DIAGNOSIS — R102 Pelvic and perineal pain: Secondary | ICD-10-CM | POA: Diagnosis present

## 2022-03-06 LAB — POCT URINALYSIS DIP (MANUAL ENTRY)
Bilirubin, UA: NEGATIVE
Blood, UA: NEGATIVE
Glucose, UA: NEGATIVE mg/dL
Ketones, POC UA: NEGATIVE mg/dL
Leukocytes, UA: NEGATIVE
Nitrite, UA: NEGATIVE
Spec Grav, UA: 1.02 (ref 1.010–1.025)
Urobilinogen, UA: 1 E.U./dL
pH, UA: 5.5 (ref 5.0–8.0)

## 2022-03-06 LAB — POCT URINE PREGNANCY: Preg Test, Ur: NEGATIVE

## 2022-03-06 NOTE — Discharge Instructions (Signed)

## 2022-03-06 NOTE — ED Provider Notes (Signed)
[facility]  Note:  This document was prepared using Conservation officer, historic buildings and may include unintentional dictation errors.  MRN: [id number] DOB: Mar 07, 1986  Subjective:   Beverly Stewart is a 36 y.o. female presenting for 2 day history of acute onset urinary frequency, mild occasional pelvic pain. No fever, n/v, vaginal discharge, hematuria, dysuria. Drinks sodas, coffee, juice. Hydrates with 1 bottle of water daily.   No current facility-administered medications for this encounter.  Current Outpatient Medications:    acetaminophen (TYLENOL) 325 MG tablet, Take 2 tablets (650 mg total) by mouth every 6 (six) hours as needed (for pain scale < 4)., Disp: , Rfl:    fexofenadine (ALLEGRA) 180 MG tablet, Take 1 tablet (180 mg total) by mouth daily., Disp: 90 tablet, Rfl: 1   fluticasone (FLONASE) 50 MCG/ACT nasal spray, Place 1 spray into both nostrils daily. Begin by using 2 sprays in each nare daily for 3 to 5 days, then decrease to 1 spray in each nare daily., Disp: 15.8 mL, Rfl: 2   ibuprofen (ADVIL) 600 MG tablet, Take 1 tablet (600 mg total) by mouth every 8 (eight) hours as needed for up to 30 doses for fever, headache, mild pain or moderate pain (Inflammation). Take 1 tablet 3 times daily as needed for inflammation of upper airways and/or pain., Disp: 30 tablet, Rfl: 0   Multiple Vitamin (MULTIVITAMIN) tablet, Take 1 tablet by mouth daily., Disp: , Rfl:    No Known Allergies  Past Medical History:  Diagnosis Date   Allergy    Headache    History of chlamydia    Infection    UTI   Seasonal allergies    Vaginal Pap smear, abnormal    colpo, ok since     Past Surgical History:  Procedure Laterality Date   COLONOSCOPY     colposcopy N/A     Family History  Problem Relation Age of Onset   Diabetes Father    Hypertension Maternal Grandmother    Diabetes Maternal Grandmother    Kidney failure Maternal Grandmother        dialysis   Asthma  Maternal Grandmother    Kidney disease Maternal Grandmother    Stomach cancer Maternal Grandmother    Stroke Maternal Grandfather    Colon cancer Maternal Grandfather        mets   Birth defects Maternal Grandfather    Hypertension Mother    Seizures Mother    Breast cancer Other    Esophageal cancer Neg Hx    Rectal cancer Neg Hx     Social History   Tobacco Use   Smoking status: Former    Types: Cigarettes    Quit date: 04/27/2015    Years since quitting: 6.8   Smokeless tobacco: Never   Tobacco comments:    only tried for a wk  Vaping Use   Vaping Use: Never used  Substance Use Topics   Alcohol use: Yes    Comment: rare   Drug use: No    ROS   Objective:   Vitals: BP (!) 144/79 (BP Location: Right Arm)   Pulse 85   Temp 98.2 F (36.8 C) (Oral)   Resp 16   LMP 02/15/2022 (Approximate)   SpO2 97%   Physical Exam Constitutional:      General: She is not in acute distress.    Appearance: Normal appearance. She is well-developed. She is not ill-appearing, toxic-appearing or diaphoretic.  HENT:  Head: Normocephalic and atraumatic.     Nose: Nose normal.     Mouth/Throat:     Mouth: Mucous membranes are moist.  Eyes:     General: No scleral icterus.       Right eye: No discharge.        Left eye: No discharge.     Extraocular Movements: Extraocular movements intact.     Conjunctiva/sclera: Conjunctivae normal.  Cardiovascular:     Rate and Rhythm: Normal rate.  Pulmonary:     Effort: Pulmonary effort is normal.  Abdominal:     General: Bowel sounds are normal. There is no distension.     Palpations: Abdomen is soft. There is no mass.     Tenderness: There is no abdominal tenderness. There is no right CVA tenderness, left CVA tenderness, guarding or rebound.  Skin:    General: Skin is warm and dry.  Neurological:     General: No focal deficit present.     Mental Status: She is alert and oriented to person, place, and time.  Psychiatric:         Mood and Affect: Mood normal.        Behavior: Behavior normal.        Thought Content: Thought content normal.        Judgment: Judgment normal.     Results for orders placed or performed during the hospital encounter of 03/06/22 (from the past 24 hour(s))  POCT urinalysis dipstick     Status: Abnormal   Collection Time: 03/06/22  9:05 AM  Result Value Ref Range   Color, UA yellow yellow   Clarity, UA cloudy (A) clear   Glucose, UA negative negative mg/dL   Bilirubin, UA negative negative   Ketones, POC UA negative negative mg/dL   Spec Grav, UA 4.097 3.532 - 1.025   Blood, UA negative negative   pH, UA 5.5 5.0 - 8.0   Protein Ur, POC trace (A) negative mg/dL   Urobilinogen, UA 1.0 0.2 or 1.0 E.U./dL   Nitrite, UA Negative Negative   Leukocytes, UA Negative Negative  POCT urine pregnancy     Status: None   Collection Time: 03/06/22  9:07 AM  Result Value Ref Range   Preg Test, Ur Negative Negative    Assessment and Plan :   PDMP not reviewed this encounter.  1. Urinary frequency   2. Pelvic pain in female     Emphasized need to hold off on urinary irritants and hydrate much better with plain water.  There is no sign of urinary tract infection on the urinalysis, we will base treatment off the urine culture.  Vaginal swab results pending, recommended treatment for BV even without vaginal discharge that she has pelvic pain. Counseled patient on potential for adverse effects with medications prescribed/recommended today, ER and return-to-clinic precautions discussed, patient verbalized understanding.    Wallis Bamberg, [place] 03/06/22 [phone]

## 2022-03-06 NOTE — ED Triage Notes (Signed)
Pt c/o urinary freq x 2 days-denies dysuria-NAD-steady gait

## 2022-03-07 LAB — URINE CULTURE: Culture: NO GROWTH

## 2022-03-07 LAB — CERVICOVAGINAL ANCILLARY ONLY
Bacterial Vaginitis (gardnerella): NEGATIVE
Chlamydia: NEGATIVE
Comment: NEGATIVE
Comment: NEGATIVE
Comment: NEGATIVE
Comment: NORMAL
Neisseria Gonorrhea: NEGATIVE
Trichomonas: NEGATIVE

## 2022-10-20 ENCOUNTER — Inpatient Hospital Stay (HOSPITAL_BASED_OUTPATIENT_CLINIC_OR_DEPARTMENT_OTHER)
Admission: EM | Admit: 2022-10-20 | Discharge: 2022-10-22 | DRG: 446 | Disposition: A | Discharge status: Home / Self Care | Payer: 59 | Attending: Internal Medicine | Admitting: Internal Medicine

## 2022-10-20 ENCOUNTER — Encounter (HOSPITAL_BASED_OUTPATIENT_CLINIC_OR_DEPARTMENT_OTHER): Payer: Self-pay | Admitting: Emergency Medicine

## 2022-10-20 ENCOUNTER — Emergency Department (HOSPITAL_BASED_OUTPATIENT_CLINIC_OR_DEPARTMENT_OTHER): Payer: 59

## 2022-10-20 ENCOUNTER — Other Ambulatory Visit: Payer: Self-pay

## 2022-10-20 DIAGNOSIS — K8020 Calculus of gallbladder without cholecystitis without obstruction: Secondary | ICD-10-CM | POA: Diagnosis not present

## 2022-10-20 DIAGNOSIS — Z87891 Personal history of nicotine dependence: Secondary | ICD-10-CM

## 2022-10-20 DIAGNOSIS — K851 Biliary acute pancreatitis without necrosis or infection: Secondary | ICD-10-CM

## 2022-10-20 DIAGNOSIS — K828 Other specified diseases of gallbladder: Secondary | ICD-10-CM | POA: Diagnosis not present

## 2022-10-20 DIAGNOSIS — J302 Other seasonal allergic rhinitis: Secondary | ICD-10-CM | POA: Diagnosis present

## 2022-10-20 DIAGNOSIS — K8080 Other cholelithiasis without obstruction: Principal | ICD-10-CM | POA: Diagnosis present

## 2022-10-20 DIAGNOSIS — R197 Diarrhea, unspecified: Secondary | ICD-10-CM | POA: Diagnosis not present

## 2022-10-20 DIAGNOSIS — K810 Acute cholecystitis: Secondary | ICD-10-CM | POA: Diagnosis not present

## 2022-10-20 DIAGNOSIS — R7401 Elevation of levels of liver transaminase levels: Secondary | ICD-10-CM | POA: Diagnosis not present

## 2022-10-20 DIAGNOSIS — Z8616 Personal history of COVID-19: Secondary | ICD-10-CM

## 2022-10-20 DIAGNOSIS — U071 COVID-19: Secondary | ICD-10-CM

## 2022-10-20 DIAGNOSIS — K859 Acute pancreatitis without necrosis or infection, unspecified: Secondary | ICD-10-CM | POA: Diagnosis not present

## 2022-10-20 DIAGNOSIS — R7989 Other specified abnormal findings of blood chemistry: Secondary | ICD-10-CM | POA: Diagnosis not present

## 2022-10-20 DIAGNOSIS — R17 Unspecified jaundice: Secondary | ICD-10-CM

## 2022-10-20 DIAGNOSIS — K8050 Calculus of bile duct without cholangitis or cholecystitis without obstruction: Secondary | ICD-10-CM

## 2022-10-20 DIAGNOSIS — Z79899 Other long term (current) drug therapy: Secondary | ICD-10-CM

## 2022-10-20 DIAGNOSIS — D1803 Hemangioma of intra-abdominal structures: Secondary | ICD-10-CM | POA: Insufficient documentation

## 2022-10-20 DIAGNOSIS — K819 Cholecystitis, unspecified: Secondary | ICD-10-CM

## 2022-10-20 DIAGNOSIS — K8070 Calculus of gallbladder and bile duct without cholecystitis without obstruction: Secondary | ICD-10-CM | POA: Diagnosis not present

## 2022-10-20 DIAGNOSIS — R112 Nausea with vomiting, unspecified: Secondary | ICD-10-CM | POA: Diagnosis not present

## 2022-10-20 DIAGNOSIS — R109 Unspecified abdominal pain: Secondary | ICD-10-CM | POA: Diagnosis not present

## 2022-10-20 LAB — HEPATITIS PANEL, ACUTE
HCV Ab: NONREACTIVE
Hep A IgM: NONREACTIVE
Hep B C IgM: NONREACTIVE
Hepatitis B Surface Ag: NONREACTIVE

## 2022-10-20 LAB — COMPREHENSIVE METABOLIC PANEL
ALT: 948 U/L — ABNORMAL HIGH (ref 0–44)
AST: 1270 U/L — ABNORMAL HIGH (ref 15–41)
Albumin: 3.9 g/dL (ref 3.5–5.0)
Alkaline Phosphatase: 66 U/L (ref 38–126)
Anion gap: 7 (ref 5–15)
BUN: 11 mg/dL (ref 6–20)
CO2: 23 mmol/L (ref 22–32)
Calcium: 9.8 mg/dL (ref 8.9–10.3)
Chloride: 104 mmol/L (ref 98–111)
Creatinine, Ser: 0.76 mg/dL (ref 0.44–1.00)
GFR, Estimated: >60 mL/min (ref >60)
Glucose, Bld: 108 mg/dL — ABNORMAL HIGH (ref 70–99)
Potassium: 3.8 mmol/L (ref 3.5–5.1)
Sodium: 134 mmol/L — ABNORMAL LOW (ref 135–145)
Total Bilirubin: 2.1 mg/dL — ABNORMAL HIGH (ref 0.3–1.2)
Total Protein: 6.8 g/dL (ref 6.5–8.1)

## 2022-10-20 LAB — URINALYSIS, W/ REFLEX TO CULTURE (INFECTION SUSPECTED)
Glucose, UA: 100 mg/dL — AB
Hgb urine dipstick: NEGATIVE
Ketones, ur: NEGATIVE mg/dL
Leukocytes,Ua: NEGATIVE
Nitrite: NEGATIVE
Protein, ur: 100 mg/dL — AB
Specific Gravity, Urine: 1.02 (ref 1.005–1.030)
pH: 8.5 — ABNORMAL HIGH (ref 5.0–8.0)

## 2022-10-20 LAB — CBC WITH DIFFERENTIAL/PLATELET
Abs Immature Granulocytes: 0.02 10*3/uL (ref 0.00–0.07)
Basophils Absolute: 0 10*3/uL (ref 0.0–0.1)
Basophils Relative: 0 %
Eosinophils Absolute: 0 10*3/uL (ref 0.0–0.5)
Eosinophils Relative: 0 %
HCT: 38.7 % (ref 36.0–46.0)
Hemoglobin: 12.9 g/dL (ref 12.0–15.0)
Immature Granulocytes: 0 %
Lymphocytes Relative: 13 %
Lymphs Abs: 0.8 10*3/uL (ref 0.7–4.0)
MCH: 30 pg (ref 26.0–34.0)
MCHC: 33.3 g/dL (ref 30.0–36.0)
MCV: 90 fL (ref 80.0–100.0)
Monocytes Absolute: 0.4 10*3/uL (ref 0.1–1.0)
Monocytes Relative: 6 %
Neutro Abs: 4.7 10*3/uL (ref 1.7–7.7)
Neutrophils Relative %: 81 %
Platelets: 278 10*3/uL (ref 150–400)
RBC: 4.3 MIL/uL (ref 3.87–5.11)
RDW: 12.5 % (ref 11.5–15.5)
WBC: 5.9 10*3/uL (ref 4.0–10.5)
nRBC: 0 % (ref 0.0–0.2)

## 2022-10-20 LAB — AMMONIA: Ammonia: 13 umol/L (ref 9–35)

## 2022-10-20 LAB — BILIRUBIN, FRACTIONATED(TOT/DIR/INDIR)
Bilirubin, Direct: 0.6 mg/dL — ABNORMAL HIGH (ref 0.0–0.2)
Indirect Bilirubin: 2.8 mg/dL — ABNORMAL HIGH (ref 0.3–0.9)
Total Bilirubin: 3.4 mg/dL — ABNORMAL HIGH (ref 0.3–1.2)

## 2022-10-20 LAB — SARS CORONAVIRUS 2 BY RT PCR: SARS Coronavirus 2 by RT PCR: POSITIVE — AB

## 2022-10-20 LAB — LIPASE, BLOOD: Lipase: 35 U/L (ref 11–51)

## 2022-10-20 LAB — PROTIME-INR
INR: 1 (ref 0.8–1.2)
Prothrombin Time: 13.8 seconds (ref 11.4–15.2)

## 2022-10-20 LAB — ACETAMINOPHEN LEVEL: Acetaminophen (Tylenol), Serum: 17 ug/mL (ref 10–30)

## 2022-10-20 LAB — PREGNANCY, URINE: Preg Test, Ur: NEGATIVE

## 2022-10-20 MED ORDER — DEXTROSE IN LACTATED RINGERS 5 % IV SOLN: INTRAVENOUS | Status: DC

## 2022-10-20 MED ORDER — IBUPROFEN 200 MG PO TABS: 200.0000 mg | ORAL_TABLET | Freq: Four times a day (QID) | ORAL | Status: DC | PRN

## 2022-10-20 MED ORDER — ENOXAPARIN SODIUM 40 MG/0.4ML IJ SOSY: 40.0000 mg | PREFILLED_SYRINGE | INTRAMUSCULAR | Status: DC

## 2022-10-20 MED ORDER — PIPERACILLIN-TAZOBACTAM 3.375 G IVPB
3.3750 g | Freq: Three times a day (TID) | INTRAVENOUS | Status: DC
  Administered 2022-10-20 – 2022-10-22 (×5): 3.375 g via INTRAVENOUS
  Filled 2022-10-20 (×5): qty 50

## 2022-10-20 MED ORDER — PIPERACILLIN-TAZOBACTAM 3.375 G IVPB 30 MIN
3.3750 g | Freq: Once | INTRAVENOUS | Status: AC
  Administered 2022-10-20: 3.375 g via INTRAVENOUS
  Filled 2022-10-20: qty 50

## 2022-10-20 MED ORDER — ADULT MULTIVITAMIN W/MINERALS CH
1.0000 | ORAL_TABLET | Freq: Every day | ORAL | Status: DC
  Administered 2022-10-21 – 2022-10-22 (×2): 1 via ORAL
  Filled 2022-10-20 (×2): qty 1

## 2022-10-20 MED ORDER — ONDANSETRON HCL 4 MG/2ML IJ SOLN: 4.0000 mg | Freq: Four times a day (QID) | INTRAMUSCULAR | Status: DC | PRN

## 2022-10-20 MED ORDER — FENTANYL CITRATE PF 50 MCG/ML IJ SOSY
50.0000 ug | PREFILLED_SYRINGE | Freq: Once | INTRAMUSCULAR | Status: AC
  Administered 2022-10-20: 50 ug via INTRAVENOUS
  Filled 2022-10-20: qty 1

## 2022-10-20 MED ORDER — HYDRALAZINE HCL 20 MG/ML IJ SOLN: 10.0000 mg | Freq: Four times a day (QID) | INTRAMUSCULAR | Status: DC | PRN

## 2022-10-20 MED ORDER — ONDANSETRON HCL 4 MG PO TABS: 4.0000 mg | ORAL_TABLET | Freq: Four times a day (QID) | ORAL | Status: DC | PRN

## 2022-10-20 MED ORDER — LACTATED RINGERS IV BOLUS
1000.0000 mL | Freq: Once | INTRAVENOUS | Status: AC
  Administered 2022-10-20: 1000 mL via INTRAVENOUS

## 2022-10-20 MED ORDER — IOHEXOL 300 MG/ML  SOLN
100.0000 mL | Freq: Once | INTRAMUSCULAR | Status: AC | PRN
  Administered 2022-10-20: 100 mL via INTRAVENOUS

## 2022-10-20 MED ORDER — MORPHINE SULFATE (PF) 2 MG/ML IV SOLN
2.0000 mg | INTRAVENOUS | Status: DC | PRN
  Administered 2022-10-21 (×2): 2 mg via INTRAVENOUS
  Filled 2022-10-20 (×2): qty 1

## 2022-10-20 MED ORDER — HEPARIN SODIUM (PORCINE) 5000 UNIT/ML IJ SOLN
5000.0000 [IU] | Freq: Three times a day (TID) | INTRAMUSCULAR | Status: DC
  Administered 2022-10-20 – 2022-10-22 (×5): 5000 [IU] via SUBCUTANEOUS
  Filled 2022-10-20 (×5): qty 1

## 2022-10-20 MED ORDER — ONDANSETRON HCL 4 MG/2ML IJ SOLN
4.0000 mg | Freq: Once | INTRAMUSCULAR | Status: AC
  Administered 2022-10-20: 4 mg via INTRAVENOUS
  Filled 2022-10-20: qty 2

## 2022-10-20 MED ORDER — MORPHINE SULFATE (PF) 2 MG/ML IV SOLN
2.0000 mg | INTRAVENOUS | Status: DC | PRN
  Administered 2022-10-20: 2 mg via INTRAVENOUS
  Filled 2022-10-20: qty 1

## 2022-10-20 NOTE — ED Provider Notes (Signed)
[facility] Provider Note   CSN: [id number] Arrival date & time: 10/20/22  4098     History  Chief Complaint  Patient presents with   Abdominal Pain    Mid     Beverly Stewart is a 37 y.o. female.  HPI      37 year old female with no significant medical history presents with concern for abdominal pain, nausea, vomiting and diarrhea.   Reports that she has had episodes of abdominal pain in the past, they have been coming more frequently over the past couple of months, she feels it has been happening more frequently since she had her child.  She has periumbilical pain radiating both up to the epigastrium and down to the pelvis that is severe.  Last night it came on approximately 11 PM, and persisted throughout the night.  Reports that the pain was severe, 8 out of 10 and she was unable to get comfortable.  Described as a burning, sharp and cramping pain.  She does not have any urinary symptoms, vaginal bleeding or discharge.  Reports sometimes the pain will be worse after eating, but not all the time sometimes it happens more spontaneously.  No radiation to the back.  No fevers.  She had 3 episodes of vomiting and 4 episodes of diarrhea this morning.  Denies any black or bloody stool or hematemesis.  Denies smoking, significant alcohol use or drug use including marijuana.  Past Medical History:  Diagnosis Date   Allergy    Headache    History of chlamydia    Infection    UTI   Seasonal allergies    Vaginal Pap smear, abnormal    colpo, ok since     Home Medications Prior to Admission medications   Medication Sig Start Date End Date Taking? Authorizing Provider  acetaminophen (TYLENOL) 325 MG tablet Take 2 tablets (650 mg total) by mouth every 6 (six) hours as needed (for pain scale < 4). 05/02/21   Charlett Nose, MD  fexofenadine (ALLEGRA) 180 MG tablet Take 1 tablet (180 mg total) by mouth daily. 09/17/21 03/16/22  Theadora Rama Scales, PA-C  fluticasone (FLONASE) 50 MCG/ACT nasal spray Place 1 spray into both nostrils daily. Begin by using 2 sprays in each nare daily for 3 to 5 days, then decrease to 1 spray in each nare daily. 09/17/21   Theadora Rama Scales, PA-C  ibuprofen (ADVIL) 600 MG tablet Take 1 tablet (600 mg total) by mouth every 8 (eight) hours as needed for up to 30 doses for fever, headache, mild pain or moderate pain (Inflammation). Take 1 tablet 3 times daily as needed for inflammation of upper airways and/or pain. 09/17/21   Theadora Rama Scales, PA-C  Multiple Vitamin (MULTIVITAMIN) tablet Take 1 tablet by mouth daily.    [provider]      Allergies    Patient has no known allergies.    Review of Systems   Review of Systems  Physical Exam Updated Vital Signs BP 124/78   Pulse 72   Temp 98.8 F (37.1 C) (Oral)   Resp 18   Wt 59.7 kg   LMP 09/26/2022 (Approximate) Comment: neg preg test  SpO2 100%   BMI 21.24 kg/m  Physical Exam Vitals and nursing note reviewed.  Constitutional:      General: She is not in acute distress.    Appearance: She is well-developed. She is not diaphoretic.  HENT:     Head: Normocephalic  and atraumatic.  Eyes:     Conjunctiva/sclera: Conjunctivae normal.  Cardiovascular:     Rate and Rhythm: Normal rate and regular rhythm.  Pulmonary:     Effort: Pulmonary effort is normal. No respiratory distress.  Abdominal:     General: There is no distension.     Palpations: Abdomen is soft.     Tenderness: There is abdominal tenderness in the epigastric area, periumbilical area and suprapubic area. There is no guarding. Negative signs include Murphy's sign and McBurney's sign.  Musculoskeletal:        General: No tenderness.     Cervical back: Normal range of motion.  Skin:    General: Skin is warm and dry.     Findings: No erythema or rash.  Neurological:     Mental Status: She is alert and oriented to person, place, and time.     ED  Results / Procedures / Treatments   Labs (all labs ordered are listed, but only abnormal results are displayed) Labs Reviewed  SARS CORONAVIRUS 2 BY RT PCR - Abnormal; Notable for the following components:      Result Value   SARS Coronavirus 2 by RT PCR POSITIVE (*)    All other components within normal limits  URINALYSIS, W/ REFLEX TO CULTURE (INFECTION SUSPECTED) - Abnormal; Notable for the following components:   APPearance HAZY (*)    pH 8.5 (*)    Glucose, UA 100 (*)    Bilirubin Urine SMALL (*)    Protein, ur 100 (*)    Bacteria, UA MANY (*)    All other components within normal limits  COMPREHENSIVE METABOLIC PANEL - Abnormal; Notable for the following components:   Sodium 134 (*)    Glucose, Bld 108 (*)    AST 1,270 (*)    ALT 948 (*)    Total Bilirubin 2.1 (*)    All other components within normal limits  PREGNANCY, URINE  CBC WITH DIFFERENTIAL/PLATELET  LIPASE, BLOOD  ACETAMINOPHEN LEVEL  PROTIME-INR  HEPATITIS PANEL, ACUTE    EKG None  Radiology US Abdomen Limited RUQ (LIVER/GB)  Result Date: 10/20/2022 CLINICAL DATA:  Right upper quadrant pain for the past week with nausea, vomiting, and diarrhea. EXAM: ULTRASOUND ABDOMEN LIMITED RIGHT UPPER QUADRANT COMPARISON:  CT abdomen pelvis from same day. FINDINGS: Gallbladder: Multiple layering tiny gallstones with mild wall thickening and trace pericholecystic fluid. Positive sonographic Murphy sign noted by sonographer. Common bile duct: Diameter: 7 mm, at the upper limits of normal. 4 mm gallstone seen within the common bile duct near the pancreatic head. Liver: 1.6 x 1.2 x 1.1 cm echogenic lesion in the left lateral liver, characterized as a hemangioma on today's CT scan. Within normal limits in parenchymal echogenicity. Portal vein is patent on color Doppler imaging with normal direction of blood flow towards the liver. Other: None. IMPRESSION: 1. Findings concerning for acute cholecystitis. 2. Choledocholithiasis without  significant biliary ductal dilatation. Electronically Signed   By: Obie Dredge M.D.   On: 10/20/2022 12:18   CT ABDOMEN PELVIS W CONTRAST  Result Date: 10/20/2022 CLINICAL DATA:  Abdominal pain with vomiting and diarrhea. EXAM: CT ABDOMEN AND PELVIS WITH CONTRAST TECHNIQUE: Multidetector CT imaging of the abdomen and pelvis was performed using the standard protocol following bolus administration of intravenous contrast. RADIATION DOSE REDUCTION: This exam was performed according to the departmental dose-optimization program which includes automated exposure control, adjustment of the mA and/or kV according to patient size and/or use of iterative reconstruction technique. CONTRAST:  OMNIPAQUE IOHEXOL 300 MG/ML  SOLN COMPARISON:  05/28/2019 FINDINGS: Lower chest: Unremarkable. Hepatobiliary: 1.7 cm lesion in the lateral segment left liver (image 9/2) shows peripheral nodular enhancement inferiorly (11/2 and coronal 72/5) consistent with benign cavernous hemangioma. No followup imaging is recommended. Gallbladder is distended with very subtle pericholecystic edema evident (image 30/2 and coronal 58/5). No calcified gallstones evident. No intrahepatic or extrahepatic biliary dilation. Pancreas: No focal mass lesion. No dilatation of the main duct. No intraparenchymal cyst. No peripancreatic edema. Spleen: No splenomegaly. No suspicious focal mass lesion. Adrenals/Urinary Tract: No adrenal nodule or mass. Kidneys unremarkable. No evidence for hydroureter. The urinary bladder appears normal for the degree of distention. Stomach/Bowel: Stomach is unremarkable. No gastric wall thickening. No evidence of outlet obstruction. Duodenum is normally positioned as is the ligament of Treitz. No small bowel wall thickening. No small bowel dilatation. The terminal ileum is normal. The appendix is normal. No gross colonic mass. No colonic wall thickening. Vascular/Lymphatic: No abdominal aortic aneurysm. No abdominal  aortic atherosclerotic calcification. Portal vein superior mesenteric vein are patent. There is no gastrohepatic or hepatoduodenal ligament lymphadenopathy. No retroperitoneal or mesenteric lymphadenopathy. No pelvic sidewall lymphadenopathy. Reproductive: Unremarkable. Other: Trace free fluid seen in the cul-de-sac. Musculoskeletal: No worrisome lytic or sclerotic osseous abnormality. IMPRESSION: 1. Distended gallbladder with very subtle pericholecystic edema. No calcified gallstones evident. Right upper quadrant ultrasound could be used to further evaluate as clinically warranted. 2. Trace free fluid in the cul-de-sac, likely physiologic. Electronically Signed   By: Kennith Center M.D.   On: 10/20/2022 10:54    Procedures Procedures    Medications Ordered in ED Medications  piperacillin-tazobactam (ZOSYN) IVPB 3.375 g (0 g Intravenous Stopped 10/20/22 1348)    Followed by  piperacillin-tazobactam (ZOSYN) IVPB 3.375 g (has no administration in time range)  iohexol (OMNIPAQUE) 300 MG/ML solution 100 mL (100 mLs Intravenous Contrast Given 10/20/22 1029)  ondansetron (ZOFRAN) injection 4 mg (4 mg Intravenous Given 10/20/22 1302)  lactated ringers bolus 1,000 mL ( Intravenous Stopped 10/20/22 1450)  fentaNYL (SUBLIMAZE) injection 50 mcg (50 mcg Intravenous Given 10/20/22 1804)    ED Course/ Medical Decision Making/ A&P                                   37 year old female with no significant medical history presents with concern for abdominal pain, nausea, vomiting and diarrhea.  DDx includes appendicitis, pancreatitis, cholecystitis, pyelonephritis, nephrolithiasis, diverticulitis, SBO, gastroenteritis, umbilical hernia, PID, ovarian torsion, ectopic pregnancy, and tuboovarian abscess.  Labs completed and personally about interpreted by me. Pregnancy test is negative.  Urinalysis with many bacteria, however without signs of urinary tract infection.  CBC shows no leukocytosis, no anemia.  CMP shows  significant elevation in AST to 1270 and ALT to 948, bilirubin up to 2.1 with a normal alk phos. normal lipase, no signs of pancreatitis.  CT abdomen pelvis shows a distended gallbladder with subtle pericholecystic edema, no calcified gallstones are evident, trace free fluid in the cul-de-sac, no sign of extra or intrahepatic biliary dilation.  Discussed with [facility] significant transaminitis.  Added INR, hepatic panel, acetaminophen level, and awaiting right upper quadrant ultrasound.  Plan for admission for further care of acute hepatitis.  RUQ [place] completed showing acute cholecystitis, choledocolithiasis without biliary ductal dilation.  Given zosyn. Consulted Dr. Maisie Fus of General Surgery, reports they will see, not planning on surgery today. NO leukocytosis, no fever, had negative Eulah Pont  sign on initial exam.  Discussed again with Zehr PA-C and Dr. Awanda [facility] GI. Plan on MRCP, does not need transfer for ED MRCP unless she has clinical change as she awaits admission.  Admitted with plan for MRCP, general surgery evaluation.  Positive for COVID 19.  Admitted to hospitalist for further care.         Final Clinical Impression(s) / ED Diagnoses Final diagnoses:  Transaminitis  COVID-19  Cholecystitis  Choledocholithiasis    Rx / DC Orders ED Discharge Orders     None         Alvira Monday, MD 10/20/22 [phone]

## 2022-10-20 NOTE — Plan of Care (Signed)
Plan of Care Note for Accepted Transfer   Patient: Beverly Stewart    KYH:[id number]     Facility requesting transfer: [facility] Requesting Provider: Dr. Dalene Seltzer, ER attending  37 year old female presents with abdominal pain, vomiting, diarrhea.  Lab work shows significant transaminitis, imaging raises question of possible acute cholecystitis, no ductal dilatation.  ER provider discussed with Dr. Marina Goodell of gastroenterology, who recommends MRCP and will consult.  Also discussed with general surgery who will evaluate the patient upon transfer, but does not plan surgery today.  Most recent vitals, labs and radiology:  Blood pressure 129/79, pulse 72, temperature 98.1 F (36.7 C), temperature source Oral, resp. rate 16, weight 59.7 kg, last menstrual period 09/26/2022, SpO2 100%, not currently breastfeeding.      Latest Ref Rng & Units 10/20/2022    9:23 AM 04/30/2021    8:36 AM 04/30/2021    1:20 AM  CBC  WBC 4.0 - 10.5 K/uL 5.9  9.3  9.0   Hemoglobin 12.0 - 15.0 g/dL 15.1  76.1  60.7   Hematocrit 36.0 - 46.0 % 38.7  34.7  32.6   Platelets 150 - 400 K/uL 278  219  216       Latest Ref Rng & Units 10/20/2022    9:23 AM 04/30/2021    1:20 AM 03/18/2019   11:25 AM  BMP  Glucose 70 - 99 mg/dL 371  86  89   BUN 6 - 20 mg/dL 11  12  14    Creatinine 0.44 - 1.00 mg/dL 0.62  6.94  8.54   Sodium 135 - 145 mmol/L 134  134  138   Potassium 3.5 - 5.1 mmol/L 3.8  4.0  3.6   Chloride 98 - 111 mmol/L 104  107  105   CO2 22 - 32 mmol/L 23  18  26    Calcium 8.9 - 10.3 mg/dL 9.8  9.5  9.5      US Abdomen Limited RUQ (LIVER/GB)  Result Date: 10/20/2022 CLINICAL DATA:  Right upper quadrant pain for the past week with nausea, vomiting, and diarrhea. EXAM: ULTRASOUND ABDOMEN LIMITED RIGHT UPPER QUADRANT COMPARISON:  CT abdomen pelvis from same day. FINDINGS: Gallbladder: Multiple layering tiny gallstones with mild wall thickening and trace pericholecystic fluid. Positive sonographic Murphy sign  noted by sonographer. Common bile duct: Diameter: 7 mm, at the upper limits of normal. 4 mm gallstone seen within the common bile duct near the pancreatic head. Liver: 1.6 x 1.2 x 1.1 cm echogenic lesion in the left lateral liver, characterized as a hemangioma on today's CT scan. Within normal limits in parenchymal echogenicity. Portal vein is patent on color Doppler imaging with normal direction of blood flow towards the liver. Other: None. IMPRESSION: 1. Findings concerning for acute cholecystitis. 2. Choledocholithiasis without significant biliary ductal dilatation. Electronically Signed   By: Obie Dredge M.D.   On: 10/20/2022 12:18   CT ABDOMEN PELVIS W CONTRAST  Result Date: 10/20/2022 CLINICAL DATA:  Abdominal pain with vomiting and diarrhea. EXAM: CT ABDOMEN AND PELVIS WITH CONTRAST TECHNIQUE: Multidetector CT imaging of the abdomen and pelvis was performed using the standard protocol following bolus administration of intravenous contrast. RADIATION DOSE REDUCTION: This exam was performed according to the departmental dose-optimization program which includes automated exposure control, adjustment of the mA and/or kV according to patient size and/or use of iterative reconstruction technique. CONTRAST:  OMNIPAQUE IOHEXOL 300 MG/ML  SOLN COMPARISON:  05/28/2019 FINDINGS: Lower chest: Unremarkable. Hepatobiliary: 1.7 cm lesion in the lateral  segment left liver (image 9/2) shows peripheral nodular enhancement inferiorly (11/2 and coronal 72/5) consistent with benign cavernous hemangioma. No followup imaging is recommended. Gallbladder is distended with very subtle pericholecystic edema evident (image 30/2 and coronal 58/5). No calcified gallstones evident. No intrahepatic or extrahepatic biliary dilation. Pancreas: No focal mass lesion. No dilatation of the main duct. No intraparenchymal cyst. No peripancreatic edema. Spleen: No splenomegaly. No suspicious focal mass lesion. Adrenals/Urinary Tract: No  adrenal nodule or mass. Kidneys unremarkable. No evidence for hydroureter. The urinary bladder appears normal for the degree of distention. Stomach/Bowel: Stomach is unremarkable. No gastric wall thickening. No evidence of outlet obstruction. Duodenum is normally positioned as is the ligament of Treitz. No small bowel wall thickening. No small bowel dilatation. The terminal ileum is normal. The appendix is normal. No gross colonic mass. No colonic wall thickening. Vascular/Lymphatic: No abdominal aortic aneurysm. No abdominal aortic atherosclerotic calcification. Portal vein superior mesenteric vein are patent. There is no gastrohepatic or hepatoduodenal ligament lymphadenopathy. No retroperitoneal or mesenteric lymphadenopathy. No pelvic sidewall lymphadenopathy. Reproductive: Unremarkable. Other: Trace free fluid seen in the cul-de-sac. Musculoskeletal: No worrisome lytic or sclerotic osseous abnormality. IMPRESSION: 1. Distended gallbladder with very subtle pericholecystic edema. No calcified gallstones evident. Right upper quadrant ultrasound could be used to further evaluate as clinically warranted. 2. Trace free fluid in the cul-de-sac, likely physiologic. Electronically Signed   By: Kennith Center M.D.   On: 10/20/2022 10:54     The patient has been accepted for transfer to [facility] or [facility], depending on bed and resource availability. The patient will remain under the care and responsibility of the referring provider until they have arrived to our inpatient facility.  Author: Otto Felkins Sharlette Dense, MD  10/20/2022  Check [email or web address] for on-call coverage.  Nursing staff, Please call [facility] Admits & Consults System-Wide number on Amion as soon as patient's arrival, so appropriate admitting provider can evaluate the pt.

## 2022-10-20 NOTE — H&P (Addendum)
History and Physical    Beverly Stewart XBJ:[id number] DOB: 1986/01/17 DOA: 10/20/2022  PCP: [facility]   Patient coming from: Home   Chief Complaint:  Chief Complaint  Patient presents with   Abdominal Pain    Mid     HPI:  Beverly Stewart is a 37 y.o. female with no significant past medical history initially presented to [facility] with complaining of abdominal pain, nausea vomiting and diarrhea for last couple of days.  Patient reported she has episodes of abdominal pain in the past and this has been occurring more frequently over the last couple of months.  And it had been getting worse since she had a child.  She patient is reporting periumbilical pain radiating both to the epigastrium and down to the pelvis which is severe.  Pain started around 11 PM on 10/19/2022 and it persisted throughout the night.  Abdominal pain 8 out of 10 intensity, sharp and cramping in nature.  Associated with nausea.  She has 2 episodes of vomiting and 4 episodes of diarrhea this morning.  Reported abdominal pain getting worse after eating but not all the time sometimes it happened more spontaneously.  Did not see any fever, chills, black tarry stool, and hematemesis.   ED Course:  At presentation to ED heart rate 93, respiratory 16, blood pressure 133/81 and O2 sat 100% room air. CBC grossly unremarkable.  Normal pro time and INR. CMP slightly low sodium 134, potassium 3.8, chloride 104, bicarb 23, blood glucose 108, BUN 11, creatinine 0.76, and calcium 9.8. Significantly elevated AST 1270, elevated ALT 984.  Normal ALP 66.  Elevated bilirubin 2.1. Lipase 35 within normal range. Hepatitis panel is negative. Pregnancy test negative UA hazy appearance, pH 8.5, glucose 100, small bilirubin, protein 100 and many bacteria.  Right upper quadrant ultrasound finding concerning for acute cholecystitis, choledocholithiasis without significant biliary duct dilation.  CT abdomen pelvis  showed distended gallbladder with subtle pericholecystic edema.  Trace free fluid in the cul-de-sac likely physiological.  ED physician discussed case with gastroenterology Dr. Marina Goodell recommended MRCP.  Also discussed case with general surgery Dr. Maisie Fus who will evaluate patient upon transfer but no plan for surgery per record.  Patient has been transferred from [facility] to [facility] for further evaluation and care.  Review of Systems:  Review of Systems  Constitutional:  Negative for chills, fever, malaise/fatigue and weight loss.  Respiratory:  Negative for cough and sputum production.   Cardiovascular:  Negative for chest pain and palpitations.  Gastrointestinal:  Positive for abdominal pain, diarrhea, nausea and vomiting. Negative for constipation and heartburn.  Genitourinary:  Negative for dysuria and urgency.  Musculoskeletal:  Negative for back pain and myalgias.  Neurological:  Negative for dizziness and headaches.  Psychiatric/Behavioral:  The patient is not nervous/anxious.     Past Medical History:  Diagnosis Date   Allergy    Headache    History of chlamydia    Infection    UTI   Seasonal allergies    Vaginal Pap smear, abnormal    colpo, ok since    Past Surgical History:  Procedure Laterality Date   COLONOSCOPY     colposcopy N/A      reports that she quit smoking about 7 years ago. Her smoking use included cigarettes. She has never used smokeless tobacco. She reports current alcohol use. She reports that she does not use drugs.  No Known Allergies  Family History  Problem  Relation Age of Onset   Diabetes Father    Hypertension Maternal Grandmother    Diabetes Maternal Grandmother    Kidney failure Maternal Grandmother        dialysis   Asthma Maternal Grandmother    Kidney disease Maternal Grandmother    Stomach cancer Maternal Grandmother    Stroke Maternal Grandfather    Colon cancer Maternal Grandfather        mets   Birth  defects Maternal Grandfather    Hypertension Mother    Seizures Mother    Breast cancer Other    Esophageal cancer Neg Hx    Rectal cancer Neg Hx     Prior to Admission medications   Medication Sig Start Date End Date Taking? Authorizing Provider  acetaminophen (TYLENOL) 325 MG tablet Take 2 tablets (650 mg total) by mouth every 6 (six) hours as needed (for pain scale < 4). 05/02/21   Charlett Nose, MD  fexofenadine (ALLEGRA) 180 MG tablet Take 1 tablet (180 mg total) by mouth daily. 09/17/21 03/16/22  Theadora Rama Scales, PA-C  fluticasone (FLONASE) 50 MCG/ACT nasal spray Place 1 spray into both nostrils daily. Begin by using 2 sprays in each nare daily for 3 to 5 days, then decrease to 1 spray in each nare daily. 09/17/21   Theadora Rama Scales, PA-C  ibuprofen (ADVIL) 600 MG tablet Take 1 tablet (600 mg total) by mouth every 8 (eight) hours as needed for up to 30 doses for fever, headache, mild pain or moderate pain (Inflammation). Take 1 tablet 3 times daily as needed for inflammation of upper airways and/or pain. 09/17/21   Theadora Rama Scales, PA-C  Multiple Vitamin (MULTIVITAMIN) tablet Take 1 tablet by mouth daily.    [provider]     Physical Exam: Vitals:   10/20/22 1452 10/20/22 1755 10/20/22 1800 10/20/22 1901  BP: 117/70 119/73 124/78 132/76  Pulse: 64 61 72 74  Resp: 18   16  Temp:  98.8 F (37.1 C)  99.3 F (37.4 C)  TempSrc:  Oral    SpO2: 100% 100% 100% 96%  Weight:      Height:    5\' 6"  (1.676 m)    Physical Exam Constitutional:      General: She is not in acute distress.    Appearance: She is not ill-appearing.  HENT:     Head: Normocephalic.  Cardiovascular:     Rate and Rhythm: Normal rate.  Pulmonary:     Effort: Pulmonary effort is normal.  Abdominal:     General: Bowel sounds are normal. There is no distension.     Palpations: Abdomen is soft. There is no hepatomegaly, splenomegaly or mass.     Tenderness: There is abdominal  tenderness in the epigastric area. There is no guarding or rebound. Negative signs include Murphy's sign.     Hernia: No hernia is present.  Skin:    Capillary Refill: Capillary refill takes less than 2 seconds.  Neurological:     Mental Status: She is oriented to person, place, and time.  Psychiatric:        Mood and Affect: Mood normal.      Labs on Admission: I have personally reviewed following labs and imaging studies  CBC: Recent Labs  Lab 10/20/22 0923  WBC 5.9  NEUTROABS 4.7  HGB 12.9  HCT 38.7  MCV 90.0  PLT 278   Basic Metabolic Panel: Recent Labs  Lab 10/20/22 0923  NA 134*  K 3.8  CL 104  CO2 23  GLUCOSE 108*  BUN 11  CREATININE 0.76  CALCIUM 9.8   GFR: Estimated Creatinine Clearance: 91.9 mL/min (by C-G formula based on SCr of 0.76 mg/dL). Liver Function Tests: Recent Labs  Lab 10/20/22 0923  AST 1,270*  ALT 948*  ALKPHOS 66  BILITOT 2.1*  PROT 6.8  ALBUMIN 3.9   Recent Labs  Lab 10/20/22 0923  LIPASE 35   No results for input(s): "AMMONIA" in the last 168 hours. Coagulation Profile: Recent Labs  Lab 10/20/22 1202  INR 1.0   Cardiac Enzymes: No results for input(s): "CKTOTAL", "CKMB", "CKMBINDEX", "TROPONINI", "TROPONINIHS" in the last 168 hours. BNP (last 3 results) No results for input(s): "BNP" in the last 8760 hours. HbA1C: No results for input(s): "HGBA1C" in the last 72 hours. CBG: No results for input(s): "GLUCAP" in the last 168 hours. Lipid Profile: No results for input(s): "CHOL", "HDL", "LDLCALC", "TRIG", "CHOLHDL", "LDLDIRECT" in the last 72 hours. Thyroid Function Tests: No results for input(s): "TSH", "T4TOTAL", "FREET4", "T3FREE", "THYROIDAB" in the last 72 hours. Anemia Panel: No results for input(s): "VITAMINB12", "FOLATE", "FERRITIN", "TIBC", "IRON", "RETICCTPCT" in the last 72 hours. Urine analysis:    Component Value Date/Time   COLORURINE YELLOW 10/20/2022 0818   APPEARANCEUR HAZY (A) 10/20/2022 0818    LABSPEC 1.020 10/20/2022 0818   PHURINE 8.5 (H) 10/20/2022 0818   GLUCOSEU 100 (A) 10/20/2022 0818   HGBUR NEGATIVE 10/20/2022 0818   BILIRUBINUR SMALL (A) 10/20/2022 0818   BILIRUBINUR negative 03/06/2022 0905   KETONESUR NEGATIVE 10/20/2022 0818   PROTEINUR 100 (A) 10/20/2022 0818   UROBILINOGEN 1.0 03/06/2022 0905   NITRITE NEGATIVE 10/20/2022 0818   LEUKOCYTESUR NEGATIVE 10/20/2022 0818    Radiological Exams on Admission: I have personally reviewed images US Abdomen Limited RUQ (LIVER/GB)  Result Date: 10/20/2022 CLINICAL DATA:  Right upper quadrant pain for the past week with nausea, vomiting, and diarrhea. EXAM: ULTRASOUND ABDOMEN LIMITED RIGHT UPPER QUADRANT COMPARISON:  CT abdomen pelvis from same day. FINDINGS: Gallbladder: Multiple layering tiny gallstones with mild wall thickening and trace pericholecystic fluid. Positive sonographic Murphy sign noted by sonographer. Common bile duct: Diameter: 7 mm, at the upper limits of normal. 4 mm gallstone seen within the common bile duct near the pancreatic head. Liver: 1.6 x 1.2 x 1.1 cm echogenic lesion in the left lateral liver, characterized as a hemangioma on today's CT scan. Within normal limits in parenchymal echogenicity. Portal vein is patent on color Doppler imaging with normal direction of blood flow towards the liver. Other: None. IMPRESSION: 1. Findings concerning for acute cholecystitis. 2. Choledocholithiasis without significant biliary ductal dilatation. Electronically Signed   By: Obie Dredge M.D.   On: 10/20/2022 12:18   CT ABDOMEN PELVIS W CONTRAST  Result Date: 10/20/2022 CLINICAL DATA:  Abdominal pain with vomiting and diarrhea. EXAM: CT ABDOMEN AND PELVIS WITH CONTRAST TECHNIQUE: Multidetector CT imaging of the abdomen and pelvis was performed using the standard protocol following bolus administration of intravenous contrast. RADIATION DOSE REDUCTION: This exam was performed according to the departmental dose-optimization  program which includes automated exposure control, adjustment of the mA and/or kV according to patient size and/or use of iterative reconstruction technique. CONTRAST:  OMNIPAQUE IOHEXOL 300 MG/ML  SOLN COMPARISON:  05/28/2019 FINDINGS: Lower chest: Unremarkable. Hepatobiliary: 1.7 cm lesion in the lateral segment left liver (image 9/2) shows peripheral nodular enhancement inferiorly (11/2 and coronal 72/5) consistent with benign cavernous hemangioma. No followup imaging is recommended. Gallbladder is distended with very  subtle pericholecystic edema evident (image 30/2 and coronal 58/5). No calcified gallstones evident. No intrahepatic or extrahepatic biliary dilation. Pancreas: No focal mass lesion. No dilatation of the main duct. No intraparenchymal cyst. No peripancreatic edema. Spleen: No splenomegaly. No suspicious focal mass lesion. Adrenals/Urinary Tract: No adrenal nodule or mass. Kidneys unremarkable. No evidence for hydroureter. The urinary bladder appears normal for the degree of distention. Stomach/Bowel: Stomach is unremarkable. No gastric wall thickening. No evidence of outlet obstruction. Duodenum is normally positioned as is the ligament of Treitz. No small bowel wall thickening. No small bowel dilatation. The terminal ileum is normal. The appendix is normal. No gross colonic mass. No colonic wall thickening. Vascular/Lymphatic: No abdominal aortic aneurysm. No abdominal aortic atherosclerotic calcification. Portal vein superior mesenteric vein are patent. There is no gastrohepatic or hepatoduodenal ligament lymphadenopathy. No retroperitoneal or mesenteric lymphadenopathy. No pelvic sidewall lymphadenopathy. Reproductive: Unremarkable. Other: Trace free fluid seen in the cul-de-sac. Musculoskeletal: No worrisome lytic or sclerotic osseous abnormality. IMPRESSION: 1. Distended gallbladder with very subtle pericholecystic edema. No calcified gallstones evident. Right upper quadrant ultrasound  could be used to further evaluate as clinically warranted. 2. Trace free fluid in the cul-de-sac, likely physiologic. Electronically Signed   By: Kennith Center M.D.   On: 10/20/2022 10:54    EKG: Pending EKG    Assessment/Plan: Principal Problem:   Transaminitis Active Problems:   Acute cholecystitis   Elevated bilirubin   Choledocholithiasis   Diarrhea   Cavernous hemangioma of liver    Assessment and Plan: Acute cholecystitis Choledocholithiasis Transaminitis Elevated bilirubin - Patient coming with complaining of mid epigastric abdominal pain associated with nausea, vomiting and diarrhea for last 1 day.  Patient has 2 episodes of nonbloody and nonbilious vomiting and 4 episodes of nonbloody loose stoo -Patient is afebrile and hemodynamically stable.  No leukocytosis.  Patient denies any alcohol use. - Elevated AST/ALT 1270/989. -Elevated bilirubin 2.1. checking indirect bilirubin. -Normal acetaminophen level. - Checking anti-smooth muscle antibody, ceruloplasmin level, viral panels include: HSV-1, Epstein-Barr virus, CMV,adenovirus, varicella-zoster antibody panel and checking ANA. -Normal lipase and normal alkaline phosphatase. - Hepatitis panel is negative - Right upper quadrant ultrasound showed echogenic lesion of left lateral liver characteristic of hemangioma.  Normal parenchymal opacity.  Finding concerning for acute cholecystitis.  Choledocholithiasis without significant biliary duct dilation. -ED physician discussed case with gastroenterology Dr. Marina Goodell recommended MRCP.  Also discussed case with general surgery Dr. Maisie Fus who will evaluate patient upon transfer but no plan for surgery per record. -Obtaining MRCP of the abdomen. - Keep patient NPO.  Continue maintenance fluid D5 LR 75 cc/h. -Will follow-up with gastroenterology and general surgery plan. -Continue IV antibiotic Zosyn for empiric coverage. -Continue morphine 2 mg every 2 hour as needed for moderate and  severe pain. -Continue Zofran as needed -Avoiding Tylenol in the setting of transaminitis.   Nonspecific diarrhea - Patient has 4 episodes of loose stool throughout last 24 hours.  Associated with abdominal pain and nausea vomiting.  Diarrhea secondary to in the setting of acute transaminitis in the setting of acute cholecystitis and choledocholithiasis.  Checking GI panel and C. difficile to rule out any source of infection.  Holding Imodium until rule out GI C. Difficile.  Hepatic cavernous hemangioma - CT abdomen pelvis showed incidental finding of: 1.7 cm lesion in the lateral segment left liver (image 9/2) shows peripheral nodular enhancement inferiorly (11/2 and coronal 72/5) consistent with benign cavernous hemangioma. -Ultrasound of her abdomen showed 1.6 x 1.2 x 1.1 echogenic lesion of  the left lateral liver characteristic of hemangiomas mentioned on the CT scan.  Within normal limit in parenchymal echogenicity. -Stable and continue to monitor.    DVT prophylaxis:  SQ Heparin Code Status:  Full Code Diet: Currently n.p.o. Family Communication: Discussed treatment plan with patient Disposition Plan: Possible cholecystectomy during this hospitalization.  Pending clinical disposition Consults: Gastroenterology and general surgery Admission status:   Inpatient, Med-Surg  Severity of Illness: The appropriate patient status for this patient is INPATIENT. Inpatient status is judged to be reasonable and necessary in order to provide the required intensity of service to ensure the patient's safety. The patient's presenting symptoms, physical exam findings, and initial radiographic and laboratory data in the context of their chronic comorbidities is felt to place them at high risk for further clinical deterioration. Furthermore, it is not anticipated that the patient will be medically stable for discharge from the hospital within 2 midnights of admission.   * I certify that at the point of  admission it is my clinical judgment that the patient will require inpatient hospital care spanning beyond 2 midnights from the point of admission due to high intensity of service, high risk for further deterioration and high frequency of surveillance required.Marland Kitchen    Tereasa Coop, MD [facility]  How to contact the [facility] Attending or Consulting provider 7A - 7P or covering provider during after hours 7P -7A, for this patient.  Check the care team in [facility] and look for a) attending/consulting [facility] provider listed and b) the [facility] team listed Log into [email or web address] and use [place]'s universal password to access. If you do not have the password, please contact the hospital operator. Locate the [facility] provider you are looking for under [facility] and page to a number that you can be directly reached. If you still have difficulty reaching the provider, please page the [facility] (Director on Call) for the Hospitalists listed on amion for assistance.  10/20/2022, 7:53 PM

## 2022-10-20 NOTE — ED Notes (Signed)
[facility] called for patient transport 

## 2022-10-20 NOTE — ED Notes (Signed)
Steady gait to restroom

## 2022-10-20 NOTE — ED Notes (Signed)
ED TO INPATIENT HANDOFF REPORT  ED Nurse Name and Phone #: Alphonse Guild Name/Age/Gender Beverly Stewart 37 y.o. female Room/Bed: MH11/MH11  Code Status   Code Status: Prior  Home/SNF/Other Admit Patient oriented to: self, place, time, and situation Is this baseline? Yes   Triage Complete: Triage complete  Chief Complaint Transaminitis [R74.01]  Triage Note Mid abd pain x last night , diarrhea , emesis last night . Hx colon polyp she said    Allergies No Known Allergies  Level of Care/Admitting Diagnosis ED Disposition     ED Disposition  Admit   Condition  --   Comment  Hospital Area: [facility] [[id number]]  Level of Care: Med-Surg [16]  May admit patient to [facility] or [facility] if equivalent level of care is available:: Yes  Interfacility transfer: Yes  Covid Evaluation: Confirmed COVID Positive  Diagnosis: Transaminitis [295188]  Admitting Physician: Maryln Gottron [[id number]]  Attending Physician: Olexa.Dam, MIR Jaxson.Roy [[id number]]  Certification:: I certify this patient will need inpatient services for at least 2 midnights  Estimated Length of Stay: 3          B Medical/Surgery History Past Medical History:  Diagnosis Date   Allergy    Headache    History of chlamydia    Infection    UTI   Seasonal allergies    Vaginal Pap smear, abnormal    colpo, ok since   Past Surgical History:  Procedure Laterality Date   COLONOSCOPY     colposcopy N/A      A IV Location/Drains/Wounds Patient Lines/Drains/Airways Status     Active Line/Drains/Airways     Name Placement date Placement time Site Days   Peripheral IV 10/20/22 22 G 1" Left Antecubital 10/20/22  0919  Antecubital  less than 1            Intake/Output Last 24 hours  Intake/Output Summary (Last 24 hours) at 10/20/2022 1747 Last data filed at 10/20/2022 1451 Gross per 24 hour  Intake 1042.79 ml  Output --  Net 1042.79 ml    Labs/Imaging Results for orders  placed or performed during the hospital encounter of 10/20/22 (from the past 48 hour(s))  Pregnancy, urine     Status: None   Collection Time: 10/20/22  8:18 AM  Result Value Ref Range   Preg Test, Ur NEGATIVE NEGATIVE    Comment:        THE SENSITIVITY OF THIS METHODOLOGY IS >25 mIU/mL. Performed at [facility], [address] [place], [place] [address]   Urinalysis, w/ Reflex to Culture (Infection Suspected) -Urine, Clean Catch     Status: Abnormal   Collection Time: 10/20/22  8:18 AM  Result Value Ref Range   Specimen Source URINE, CLEAN CATCH    Color, Urine YELLOW YELLOW   APPearance HAZY (A) CLEAR   Specific Gravity, Urine 1.020 1.005 - 1.030   pH 8.5 (H) 5.0 - 8.0   Glucose, UA 100 (A) NEGATIVE mg/dL   Hgb urine dipstick NEGATIVE NEGATIVE   Bilirubin Urine SMALL (A) NEGATIVE   Ketones, ur NEGATIVE NEGATIVE mg/dL   Protein, ur 732 (A) NEGATIVE mg/dL   Nitrite NEGATIVE NEGATIVE   Leukocytes,Ua NEGATIVE NEGATIVE   Squamous Epithelial / HPF 6-10 0 - 5 /HPF   WBC, UA 0-5 0 - 5 WBC/hpf    Comment: Reflex urine culture not performed if WBC <=10, OR if Squamous epithelial cells >5. If Squamous epithelial cells >5, suggest recollection.  RBC / HPF 0-5 0 - 5 RBC/hpf   Bacteria, UA MANY (A) NONE SEEN   Mucus PRESENT     Comment: Performed at [facility], [address] [place], [place] [address]  CBC with Differential     Status: None   Collection Time: 10/20/22  9:23 AM  Result Value Ref Range   WBC 5.9 4.0 - 10.5 K/uL   RBC 4.30 3.87 - 5.11 MIL/uL   Hemoglobin 12.9 12.0 - 15.0 g/dL   HCT 69.6 29.5 - 28.4 %   MCV 90.0 80.0 - 100.0 fL   MCH 30.0 26.0 - 34.0 pg   MCHC 33.3 30.0 - 36.0 g/dL   RDW 13.2 44.0 - 10.2 %   Platelets 278 150 - 400 K/uL   nRBC 0.0 0.0 - 0.2 %   Neutrophils Relative % 81 %   Neutro Abs 4.7 1.7 - 7.7 K/uL   Lymphocytes Relative 13 %   Lymphs Abs 0.8 0.7 - 4.0 K/uL   Monocytes Relative 6 %   Monocytes Absolute 0.4 0.1 - 1.0  K/uL   Eosinophils Relative 0 %   Eosinophils Absolute 0.0 0.0 - 0.5 K/uL   Basophils Relative 0 %   Basophils Absolute 0.0 0.0 - 0.1 K/uL   Immature Granulocytes 0 %   Abs Immature Granulocytes 0.02 0.00 - 0.07 K/uL    Comment: Performed at [facility], [address] [place], [place] [address]  Comprehensive metabolic panel     Status: Abnormal   Collection Time: 10/20/22  9:23 AM  Result Value Ref Range   Sodium 134 (L) 135 - 145 mmol/L   Potassium 3.8 3.5 - 5.1 mmol/L   Chloride 104 98 - 111 mmol/L   CO2 23 22 - 32 mmol/L   Glucose, Bld 108 (H) 70 - 99 mg/dL    Comment: Glucose reference range applies only to samples taken after fasting for at least 8 hours.   BUN 11 6 - 20 mg/dL   Creatinine, Ser 6.44 0.44 - 1.00 mg/dL   Calcium 9.8 8.9 - 03.4 mg/dL   Total Protein 6.8 6.5 - 8.1 g/dL   Albumin 3.9 3.5 - 5.0 g/dL   AST 7,425 (H) 15 - 41 U/L   ALT 948 (H) 0 - 44 U/L   Alkaline Phosphatase 66 38 - 126 U/L   Total Bilirubin 2.1 (H) 0.3 - 1.2 mg/dL   GFR, Estimated >95 >63 mL/min    Comment: (NOTE) Calculated using the CKD-EPI Creatinine Equation (2021)    Anion gap 7 5 - 15    Comment: Performed at [facility], [address] [place], [place] [address]  Lipase, blood     Status: None   Collection Time: 10/20/22  9:23 AM  Result Value Ref Range   Lipase 35 11 - 51 U/L    Comment: Performed at [facility], [address] [place], [place] [address]  SARS Coronavirus 2 by RT PCR (hospital order, performed in [facility] hospital lab) *cepheid single result test* Anterior Nasal Swab     Status: Abnormal   Collection Time: 10/20/22 12:02 PM   Specimen: Anterior Nasal Swab  Result Value Ref Range   SARS Coronavirus 2 by RT PCR POSITIVE (A) NEGATIVE    Comment: (NOTE) SARS-CoV-2 target nucleic acids are DETECTED  SARS-CoV-2 RNA is generally detectable in upper respiratory specimens  during the acute phase of infection.  Positive results  are indicative  of the presence of the identified virus, but do not rule out bacterial infection or co-infection with other pathogens not detected by the test.  Clinical correlation with patient history and  other diagnostic information is necessary to determine patient infection status.  The expected result is negative.  Fact Sheet for Patients:   RoadLapTop.co.za   Fact Sheet for Healthcare Providers:   http://kim-miller.com/    This test is not yet approved or cleared by the [place] FDA and  has been authorized for detection and/or diagnosis of SARS-CoV-2 by FDA under an Emergency Use Authorization (EUA).  This EUA will remain in effect (meaning this test can be used) for the duration of  the COVID-19 declaration under Section 564(b)(1)  of the Act, 21 U.S.C. section 360-bbb-3(b)(1), unless the authorization is terminated or revoked sooner.   Performed at [facility], [address] [place], [place] [address]   Acetaminophen level     Status: None   Collection Time: 10/20/22 12:02 PM  Result Value Ref Range   Acetaminophen (Tylenol), Serum 17 10 - 30 ug/mL    Comment: (NOTE) Therapeutic concentrations vary significantly. A range of 10-30 ug/mL  may be an effective concentration for many patients. However, some  are best treated at concentrations outside of this range. Acetaminophen concentrations >150 ug/mL at 4 hours after ingestion  and >50 ug/mL at 12 hours after ingestion are often associated with  toxic reactions.  Performed at [facility], [address] [place], [place] [address]   Protime-INR     Status: None   Collection Time: 10/20/22 12:02 PM  Result Value Ref Range   Prothrombin Time 13.8 11.4 - 15.2 seconds   INR 1.0 0.8 - 1.2    Comment: (NOTE) INR goal varies based on device and disease states. Performed at [facility], [address] [place], [place] [address]     US Abdomen Limited RUQ (LIVER/GB)  Result Date: 10/20/2022 CLINICAL DATA:  Right upper quadrant pain for the past week with nausea, vomiting, and diarrhea. EXAM: ULTRASOUND ABDOMEN LIMITED RIGHT UPPER QUADRANT COMPARISON:  CT abdomen pelvis from same day. FINDINGS: Gallbladder: Multiple layering tiny gallstones with mild wall thickening and trace pericholecystic fluid. Positive sonographic Murphy sign noted by sonographer. Common bile duct: Diameter: 7 mm, at the upper limits of normal. 4 mm gallstone seen within the common bile duct near the pancreatic head. Liver: 1.6 x 1.2 x 1.1 cm echogenic lesion in the left lateral liver, characterized as a hemangioma on today's CT scan. Within normal limits in parenchymal echogenicity. Portal vein is patent on color Doppler imaging with normal direction of blood flow towards the liver. Other: None. IMPRESSION: 1. Findings concerning for acute cholecystitis. 2. Choledocholithiasis without significant biliary ductal dilatation. Electronically Signed   By: Obie Dredge M.D.   On: 10/20/2022 12:18   CT ABDOMEN PELVIS W CONTRAST  Result Date: 10/20/2022 CLINICAL DATA:  Abdominal pain with vomiting and diarrhea. EXAM: CT ABDOMEN AND PELVIS WITH CONTRAST TECHNIQUE: Multidetector CT imaging of the abdomen and pelvis was performed using the standard protocol following bolus administration of intravenous contrast. RADIATION DOSE REDUCTION: This exam was performed according to the departmental dose-optimization program which includes automated exposure control, adjustment of the mA and/or kV according to patient size and/or use of iterative reconstruction technique. CONTRAST:  OMNIPAQUE IOHEXOL 300 MG/ML  SOLN COMPARISON:  05/28/2019 FINDINGS: Lower chest: Unremarkable. Hepatobiliary: 1.7 cm lesion in  the lateral segment left liver (image 9/2) shows peripheral nodular enhancement inferiorly (11/2 and coronal 72/5) consistent with benign cavernous hemangioma. No followup  imaging is recommended. Gallbladder is distended with very subtle pericholecystic edema evident (image 30/2 and coronal 58/5). No calcified gallstones evident. No intrahepatic or extrahepatic biliary dilation. Pancreas: No focal mass lesion. No dilatation of the main duct. No intraparenchymal cyst. No peripancreatic edema. Spleen: No splenomegaly. No suspicious focal mass lesion. Adrenals/Urinary Tract: No adrenal nodule or mass. Kidneys unremarkable. No evidence for hydroureter. The urinary bladder appears normal for the degree of distention. Stomach/Bowel: Stomach is unremarkable. No gastric wall thickening. No evidence of outlet obstruction. Duodenum is normally positioned as is the ligament of Treitz. No small bowel wall thickening. No small bowel dilatation. The terminal ileum is normal. The appendix is normal. No gross colonic mass. No colonic wall thickening. Vascular/Lymphatic: No abdominal aortic aneurysm. No abdominal aortic atherosclerotic calcification. Portal vein superior mesenteric vein are patent. There is no gastrohepatic or hepatoduodenal ligament lymphadenopathy. No retroperitoneal or mesenteric lymphadenopathy. No pelvic sidewall lymphadenopathy. Reproductive: Unremarkable. Other: Trace free fluid seen in the cul-de-sac. Musculoskeletal: No worrisome lytic or sclerotic osseous abnormality. IMPRESSION: 1. Distended gallbladder with very subtle pericholecystic edema. No calcified gallstones evident. Right upper quadrant ultrasound could be used to further evaluate as clinically warranted. 2. Trace free fluid in the cul-de-sac, likely physiologic. Electronically Signed   By: Kennith Center M.D.   On: 10/20/2022 10:54    Pending Labs Unresulted Labs (From admission, onward)     Start     Ordered   10/20/22 1117  Hepatitis panel, acute  Once,   URGENT        10/20/22 1116            Vitals/Pain Today's Vitals   10/20/22 1400 10/20/22 1451 10/20/22 1452 10/20/22 1455  BP: 117/70   117/70   Pulse: (!) 58  64   Resp:   18   Temp:      TempSrc:      SpO2: 100%  100%   Weight:      PainSc:  6   4     Isolation Precautions Airborne and Contact precautions  Medications Medications  piperacillin-tazobactam (ZOSYN) IVPB 3.375 g (0 g Intravenous Stopped 10/20/22 1348)    Followed by  piperacillin-tazobactam (ZOSYN) IVPB 3.375 g (has no administration in time range)  iohexol (OMNIPAQUE) 300 MG/ML solution 100 mL (100 mLs Intravenous Contrast Given 10/20/22 1029)  ondansetron (ZOFRAN) injection 4 mg (4 mg Intravenous Given 10/20/22 1302)  lactated ringers bolus 1,000 mL ( Intravenous Stopped 10/20/22 1450)    Mobility walks     Focused Assessments GI, cholycysitis   R Recommendations: See Admitting Provider Note  Report given to:   Additional Notes: Call with any questions, [phone]

## 2022-10-20 NOTE — ED Triage Notes (Signed)
Mid abd pain x last night , diarrhea , emesis last night . Hx colon polyp she said

## 2022-10-21 ENCOUNTER — Inpatient Hospital Stay (HOSPITAL_COMMUNITY): Payer: 59

## 2022-10-21 DIAGNOSIS — R7401 Elevation of levels of liver transaminase levels: Secondary | ICD-10-CM

## 2022-10-21 DIAGNOSIS — K828 Other specified diseases of gallbladder: Secondary | ICD-10-CM

## 2022-10-21 DIAGNOSIS — U071 COVID-19: Secondary | ICD-10-CM

## 2022-10-21 DIAGNOSIS — K810 Acute cholecystitis: Secondary | ICD-10-CM

## 2022-10-21 DIAGNOSIS — R109 Unspecified abdominal pain: Secondary | ICD-10-CM

## 2022-10-21 DIAGNOSIS — R7989 Other specified abnormal findings of blood chemistry: Secondary | ICD-10-CM | POA: Diagnosis not present

## 2022-10-21 DIAGNOSIS — R112 Nausea with vomiting, unspecified: Secondary | ICD-10-CM | POA: Diagnosis not present

## 2022-10-21 DIAGNOSIS — R197 Diarrhea, unspecified: Secondary | ICD-10-CM | POA: Diagnosis not present

## 2022-10-21 DIAGNOSIS — K8070 Calculus of gallbladder and bile duct without cholecystitis without obstruction: Secondary | ICD-10-CM

## 2022-10-21 LAB — FERRITIN: Ferritin: 75 ng/mL (ref 11–307)

## 2022-10-21 LAB — PROTIME-INR
INR: 1.1 (ref 0.8–1.2)
Prothrombin Time: 14.6 s (ref 11.4–15.2)

## 2022-10-21 LAB — BASIC METABOLIC PANEL
Anion gap: 8 (ref 5–15)
BUN: 7 mg/dL (ref 6–20)
CO2: 23 mmol/L (ref 22–32)
Calcium: 9 mg/dL (ref 8.9–10.3)
Chloride: 106 mmol/L (ref 98–111)
Creatinine, Ser: 0.96 mg/dL (ref 0.44–1.00)
GFR, Estimated: >60 mL/min (ref >60)
Glucose, Bld: 98 mg/dL (ref 70–99)
Potassium: 3.9 mmol/L (ref 3.5–5.1)
Sodium: 137 mmol/L (ref 135–145)

## 2022-10-21 LAB — CBC WITH DIFFERENTIAL/PLATELET
Abs Immature Granulocytes: 0.01 10*3/uL (ref 0.00–0.07)
Basophils Absolute: 0 10*3/uL (ref 0.0–0.1)
Basophils Relative: 1 %
Eosinophils Absolute: 0.1 10*3/uL (ref 0.0–0.5)
Eosinophils Relative: 2 %
HCT: 38.2 % (ref 36.0–46.0)
Hemoglobin: 12.4 g/dL (ref 12.0–15.0)
Immature Granulocytes: 0 %
Lymphocytes Relative: 42 %
Lymphs Abs: 1.6 10*3/uL (ref 0.7–4.0)
MCH: 30.5 pg (ref 26.0–34.0)
MCHC: 32.5 g/dL (ref 30.0–36.0)
MCV: 94.1 fL (ref 80.0–100.0)
Monocytes Absolute: 0.3 10*3/uL (ref 0.1–1.0)
Monocytes Relative: 8 %
Neutro Abs: 1.8 10*3/uL (ref 1.7–7.7)
Neutrophils Relative %: 47 %
Platelets: 267 10*3/uL (ref 150–400)
RBC: 4.06 MIL/uL (ref 3.87–5.11)
RDW: 12.8 % (ref 11.5–15.5)
WBC: 3.8 10*3/uL — ABNORMAL LOW (ref 4.0–10.5)
nRBC: 0 % (ref 0.0–0.2)

## 2022-10-21 LAB — IRON AND TIBC
Iron: 216 ug/dL — ABNORMAL HIGH (ref 28–170)
Saturation Ratios: 64 % — ABNORMAL HIGH (ref 10.4–31.8)
TIBC: 339 ug/dL (ref 250–450)
UIBC: 123 ug/dL

## 2022-10-21 LAB — HEMOGLOBIN A1C
Hgb A1c MFr Bld: 5.1 % (ref 4.8–5.6)
Mean Plasma Glucose: 99.67 mg/dL

## 2022-10-21 LAB — LIPID PANEL
Cholesterol: 138 mg/dL (ref 0–200)
HDL: 45 mg/dL (ref >40)
LDL Cholesterol: 80 mg/dL (ref 0–99)
Total CHOL/HDL Ratio: 3.1 ratio
Triglycerides: 65 mg/dL (ref <150)
VLDL: 13 mg/dL (ref 0–40)

## 2022-10-21 LAB — HEPATIC FUNCTION PANEL
ALT: 1295 U/L — ABNORMAL HIGH (ref 0–44)
AST: 662 U/L — ABNORMAL HIGH (ref 15–41)
Albumin: 3.5 g/dL (ref 3.5–5.0)
Alkaline Phosphatase: 85 U/L (ref 38–126)
Bilirubin, Direct: 0.4 mg/dL — ABNORMAL HIGH (ref 0.0–0.2)
Indirect Bilirubin: 2.5 mg/dL — ABNORMAL HIGH (ref 0.3–0.9)
Total Bilirubin: 2.9 mg/dL — ABNORMAL HIGH (ref 0.3–1.2)
Total Protein: 6.7 g/dL (ref 6.5–8.1)

## 2022-10-21 LAB — APTT: aPTT: 29 s (ref 24–36)

## 2022-10-21 LAB — HIV ANTIBODY (ROUTINE TESTING W REFLEX): HIV Screen 4th Generation wRfx: NONREACTIVE

## 2022-10-21 MED ORDER — POLYVINYL ALCOHOL 1.4 % OP SOLN
1.0000 [drp] | OPHTHALMIC | Status: DC | PRN
  Administered 2022-10-22: 1 [drp] via OPHTHALMIC
  Filled 2022-10-21: qty 15

## 2022-10-21 MED ORDER — GADOBUTROL 1 MMOL/ML IV SOLN
6.0000 mL | Freq: Once | INTRAVENOUS | Status: AC | PRN
  Administered 2022-10-21: 6 mL via INTRAVENOUS

## 2022-10-21 NOTE — Assessment & Plan Note (Addendum)
-   Low suspicion for being symptomatic.  Her symptoms seem more explained by the above process.  Furthermore, she endorses being very symptomatic with COVID approximately in June.  PCR may simply be positive from dead viral shedding - no need for treatment at this time; supportive care - continue precautions - CT value 38, consistent with older and resolving infection

## 2022-10-21 NOTE — Progress Notes (Signed)
Progress Note    Beverly Stewart   ZOX:[id number]  DOB: [birth year]  DOA: 10/20/2022     1 PCP: [facility]  Initial CC: N/V/D, abd pain  Hospital Course: Beverly Stewart is a 37 yo female with no pertinent PMH who presented to [facility] with abdominal pain and N/V/D for approximately ~2 days prior to admission.  Multiple imaging studies performed including CT A/P, RUQ u/s, and MRCP.  Ultimately found to have cholelithiasis but no choledocholithiasis. The ultrasound was notable with GB wall thickening and trace pericholecysitic fluid.  Initial labs notable with Alk phos 66, AST 1,270, ALT 948, and TB 2.1.   She was started on IVF, zosyn, and transferred for GI and general surgery evaluation.  Interval History:  Seen this morning resting in bed appearing much more comfortable compared to admission.  She states that her abdominal pain has improved along with her nausea/vomiting.  She is amenable with trial of liquid diet today also.  No plans for surgery currently.  Assessment and Plan: * Acute cholecystitis - Mild gallbladder wall thickening and trace pericholecystic fluid noted on ultrasound concerning for acute cholecystitis however symptoms have improved since admission which possibly supports a passed stone -Lipase normal - General surgery following, no plans for surgery at this time.  Possibly can be done outpatient electively; follow-up further recommendations -No leukocytosis nor fever.  Continue Zosyn for now  Transaminitis - elevated on admission with associated N/V/D and abd pain - AST 1270 >> 662 - ALT 948 >> 1295 - TB 2.1 >> 3.4 >> 2.5 - ALP 66 >>85 - no choledoco on MRCP but may have passed a stone and LFTs lagging, but ALT >1000 does raise concern for liver pathology  - GI has been consulted and further AI and infectious workup underway  - Hep panel negative  - repeat LFTs in am  Diarrhea - Suspect related to cholecystitis/transaminitis -If continues, obtain C.  difficile and GI panel  COVID-19 virus infection - Low suspicion for being symptomatic.  Her symptoms seem more explained by the above process.  Furthermore, she endorses being very symptomatic with COVID approximately in June.  PCR may simply be positive from dead viral shedding - no need for treatment at this time; supportive care - continue precautions - CT value 38, consistent with older and resolving infection   Old records reviewed in assessment of this patient  Antimicrobials: Zosyn 10/20/22 >> current   DVT prophylaxis:  heparin injection 5,000 Units Start: 10/20/22 2200 SCDs Start: 10/20/22 1923   Code Status:   Code Status: Full Code  Mobility Assessment (Last 72 Hours)     Mobility Assessment     Row Name 10/21/22 0748 10/20/22 2200         Does patient have an order for bedrest or is patient medically unstable No - Continue assessment No - Continue assessment      What is the highest level of mobility based on the progressive mobility assessment? Level 6 (Walks independently in room and hall) - Balance while walking in room without assist - Complete Level 6 (Walks independently in room and hall) - Balance while walking in room without assist - Complete               Barriers to discharge: none Disposition Plan:  Home Status is: Inpt  Objective: Blood pressure 110/71, pulse 69, temperature 98.6 F (37 C), resp. rate 18, height 5\' 6"  (1.676 m), weight 59.7 kg, last menstrual period 09/26/2022, SpO2  100%, not currently breastfeeding.  Examination:  Physical Exam Constitutional:      General: She is not in acute distress.    Appearance: She is well-developed.  HENT:     Head: Normocephalic and atraumatic.     Mouth/Throat:     Mouth: Mucous membranes are moist.  Eyes:     Extraocular Movements: Extraocular movements intact.  Cardiovascular:     Rate and Rhythm: Normal rate and regular rhythm.  Pulmonary:     Effort: Pulmonary effort is normal.      Breath sounds: Normal breath sounds.  Abdominal:     General: Bowel sounds are normal. There is no distension.     Palpations: Abdomen is soft.  Musculoskeletal:        General: Normal range of motion.     Cervical back: Normal range of motion and neck supple.  Skin:    General: Skin is warm and dry.  Neurological:     General: No focal deficit present.     Mental Status: She is alert and oriented to person, place, and time.  Psychiatric:        Mood and Affect: Mood normal.        Behavior: Behavior normal.      Consultants:  GI General surgery   Procedures:    Data Reviewed: Results for orders placed or performed during the hospital encounter of 10/20/22 (from the past 24 hour(s))  Bilirubin, fractionated(tot/dir/indir)     Status: Abnormal   Collection Time: 10/20/22  8:55 PM  Result Value Ref Range   Total Bilirubin 3.4 (H) 0.3 - 1.2 mg/dL   Bilirubin, Direct 0.6 (H) 0.0 - 0.2 mg/dL   Indirect Bilirubin 2.8 (H) 0.3 - 0.9 mg/dL  Ammonia     Status: None   Collection Time: 10/20/22  9:18 PM  Result Value Ref Range   Ammonia 13 9 - 35 umol/L  HIV Antibody (routine testing w rflx)     Status: None   Collection Time: 10/21/22  7:35 AM  Result Value Ref Range   HIV Screen 4th Generation wRfx Non Reactive Non Reactive  Lipid panel     Status: None   Collection Time: 10/21/22  7:35 AM  Result Value Ref Range   Cholesterol 138 0 - 200 mg/dL   Triglycerides 65 <086 mg/dL   HDL 45 >57 mg/dL   Total CHOL/HDL Ratio 3.1 RATIO   VLDL 13 0 - 40 mg/dL   LDL Cholesterol 80 0 - 99 mg/dL  Protime-INR     Status: None   Collection Time: 10/21/22  7:35 AM  Result Value Ref Range   Prothrombin Time 14.6 11.4 - 15.2 seconds   INR 1.1 0.8 - 1.2  APTT     Status: None   Collection Time: 10/21/22  7:35 AM  Result Value Ref Range   aPTT 29 24 - 36 seconds  Hemoglobin A1c     Status: None   Collection Time: 10/21/22  7:35 AM  Result Value Ref Range   Hgb A1c MFr Bld 5.1 4.8 - 5.6  %   Mean Plasma Glucose 99.67 mg/dL  Basic metabolic panel     Status: None   Collection Time: 10/21/22  7:35 AM  Result Value Ref Range   Sodium 137 135 - 145 mmol/L   Potassium 3.9 3.5 - 5.1 mmol/L   Chloride 106 98 - 111 mmol/L   CO2 23 22 - 32 mmol/L   Glucose, Bld 98 70 - 99  mg/dL   BUN 7 6 - 20 mg/dL   Creatinine, Ser 4.09 0.44 - 1.00 mg/dL   Calcium 9.0 8.9 - 81.1 mg/dL   GFR, Estimated >91 >47 mL/min   Anion gap 8 5 - 15  Hepatic function panel     Status: Abnormal   Collection Time: 10/21/22  7:35 AM  Result Value Ref Range   Total Protein 6.7 6.5 - 8.1 g/dL   Albumin 3.5 3.5 - 5.0 g/dL   AST 829 (H) 15 - 41 U/L   ALT 1,295 (H) 0 - 44 U/L   Alkaline Phosphatase 85 38 - 126 U/L   Total Bilirubin 2.9 (H) 0.3 - 1.2 mg/dL   Bilirubin, Direct 0.4 (H) 0.0 - 0.2 mg/dL   Indirect Bilirubin 2.5 (H) 0.3 - 0.9 mg/dL  CBC with Differential/Platelet     Status: Abnormal   Collection Time: 10/21/22  7:35 AM  Result Value Ref Range   WBC 3.8 (L) 4.0 - 10.5 K/uL   RBC 4.06 3.87 - 5.11 MIL/uL   Hemoglobin 12.4 12.0 - 15.0 g/dL   HCT 56.2 13.0 - 86.5 %   MCV 94.1 80.0 - 100.0 fL   MCH 30.5 26.0 - 34.0 pg   MCHC 32.5 30.0 - 36.0 g/dL   RDW 78.4 69.6 - 29.5 %   Platelets 267 150 - 400 K/uL   nRBC 0.0 0.0 - 0.2 %   Neutrophils Relative % 47 %   Neutro Abs 1.8 1.7 - 7.7 K/uL   Lymphocytes Relative 42 %   Lymphs Abs 1.6 0.7 - 4.0 K/uL   Monocytes Relative 8 %   Monocytes Absolute 0.3 0.1 - 1.0 K/uL   Eosinophils Relative 2 %   Eosinophils Absolute 0.1 0.0 - 0.5 K/uL   Basophils Relative 1 %   Basophils Absolute 0.0 0.0 - 0.1 K/uL   Immature Granulocytes 0 %   Abs Immature Granulocytes 0.01 0.00 - 0.07 K/uL    I have reviewed pertinent nursing notes, vitals, labs, and images as necessary. I have ordered labwork to follow up on as indicated.  I have reviewed the last notes from staff over past 24 hours. I have discussed patient's care plan and test results with nursing staff,  CM/SW, and other staff as appropriate.  Time spent: Greater than 50% of the 55 minute visit was spent in counseling/coordination of care for the patient as laid out in the A&P.   LOS: 1 day   Lewie Chamber, MD [facility] 10/21/2022, 1:01 PM

## 2022-10-21 NOTE — Plan of Care (Signed)

## 2022-10-21 NOTE — Assessment & Plan Note (Signed)
-   Suspect related to cholecystitis/transaminitis -Resolved prior to discharge

## 2022-10-21 NOTE — Consult Note (Signed)
HPI: Beverly Stewart is an 37 y.o. female who is here for abd pain, nausea, vomiting and diarrhea.  She reports pain that radiates to her pelvis.  Denies RUQ pain  Past Medical History:  Diagnosis Date   Allergy    Headache    History of chlamydia    Infection    UTI   Seasonal allergies    Vaginal Pap smear, abnormal    colpo, ok since    Past Surgical History:  Procedure Laterality Date   COLONOSCOPY     colposcopy N/A     Family History  Problem Relation Age of Onset   Diabetes Father    Hypertension Maternal Grandmother    Diabetes Maternal Grandmother    Kidney failure Maternal Grandmother        dialysis   Asthma Maternal Grandmother    Kidney disease Maternal Grandmother    Stomach cancer Maternal Grandmother    Stroke Maternal Grandfather    Colon cancer Maternal Grandfather        mets   Birth defects Maternal Grandfather    Hypertension Mother    Seizures Mother    Breast cancer Other    Esophageal cancer Neg Hx    Rectal cancer Neg Hx     Social:  reports that she quit smoking about 7 years ago. Her smoking use included cigarettes. She has never used smokeless tobacco. She reports current alcohol use. She reports that she does not use drugs.  Allergies: No Known Allergies  Medications: I have reviewed the patient's current medications.  Results for orders placed or performed during the hospital encounter of 10/20/22 (from the past 48 hour(s))  Pregnancy, urine     Status: None   Collection Time: 10/20/22  8:18 AM  Result Value Ref Range   Preg Test, Ur NEGATIVE NEGATIVE    Comment:        THE SENSITIVITY OF THIS METHODOLOGY IS >25 mIU/mL. Performed at [facility], [address] [place], [place] [address]   Urinalysis, w/ Reflex to Culture (Infection Suspected) -Urine, Clean Catch     Status: Abnormal   Collection Time: 10/20/22  8:18 AM  Result Value Ref Range   Specimen Source URINE, CLEAN CATCH    Color, Urine YELLOW YELLOW    APPearance HAZY (A) CLEAR   Specific Gravity, Urine 1.020 1.005 - 1.030   pH 8.5 (H) 5.0 - 8.0   Glucose, UA 100 (A) NEGATIVE mg/dL   Hgb urine dipstick NEGATIVE NEGATIVE   Bilirubin Urine SMALL (A) NEGATIVE   Ketones, ur NEGATIVE NEGATIVE mg/dL   Protein, ur 109 (A) NEGATIVE mg/dL   Nitrite NEGATIVE NEGATIVE   Leukocytes,Ua NEGATIVE NEGATIVE   Squamous Epithelial / HPF 6-10 0 - 5 /HPF   WBC, UA 0-5 0 - 5 WBC/hpf    Comment: Reflex urine culture not performed if WBC <=10, OR if Squamous epithelial cells >5. If Squamous epithelial cells >5, suggest recollection.   RBC / HPF 0-5 0 - 5 RBC/hpf   Bacteria, UA MANY (A) NONE SEEN   Mucus PRESENT     Comment: Performed at [facility], [address] [place], [place] [address]  CBC with Differential     Status: None   Collection Time: 10/20/22  9:23 AM  Result Value Ref Range   WBC 5.9 4.0 - 10.5 K/uL   RBC 4.30 3.87 - 5.11 MIL/uL   Hemoglobin 12.9 12.0 - 15.0 g/dL  HCT 38.7 36.0 - 46.0 %   MCV 90.0 80.0 - 100.0 fL   MCH 30.0 26.0 - 34.0 pg   MCHC 33.3 30.0 - 36.0 g/dL   RDW 72.5 36.6 - 44.0 %   Platelets 278 150 - 400 K/uL   nRBC 0.0 0.0 - 0.2 %   Neutrophils Relative % 81 %   Neutro Abs 4.7 1.7 - 7.7 K/uL   Lymphocytes Relative 13 %   Lymphs Abs 0.8 0.7 - 4.0 K/uL   Monocytes Relative 6 %   Monocytes Absolute 0.4 0.1 - 1.0 K/uL   Eosinophils Relative 0 %   Eosinophils Absolute 0.0 0.0 - 0.5 K/uL   Basophils Relative 0 %   Basophils Absolute 0.0 0.0 - 0.1 K/uL   Immature Granulocytes 0 %   Abs Immature Granulocytes 0.02 0.00 - 0.07 K/uL    Comment: Performed at [facility], [address] [place], [place] [address]  Comprehensive metabolic panel     Status: Abnormal   Collection Time: 10/20/22  9:23 AM  Result Value Ref Range   Sodium 134 (L) 135 - 145 mmol/L   Potassium 3.8 3.5 - 5.1 mmol/L   Chloride 104 98 - 111 mmol/L   CO2 23 22 - 32 mmol/L   Glucose, Bld 108 (H) 70 - 99 mg/dL    Comment:  Glucose reference range applies only to samples taken after fasting for at least 8 hours.   BUN 11 6 - 20 mg/dL   Creatinine, Ser 5.95 0.44 - 1.00 mg/dL   Calcium 9.8 8.9 - 63.8 mg/dL   Total Protein 6.8 6.5 - 8.1 g/dL   Albumin 3.9 3.5 - 5.0 g/dL   AST 7,564 (H) 15 - 41 U/L   ALT 948 (H) 0 - 44 U/L   Alkaline Phosphatase 66 38 - 126 U/L   Total Bilirubin 2.1 (H) 0.3 - 1.2 mg/dL   GFR, Estimated >33 >29 mL/min    Comment: (NOTE) Calculated using the CKD-EPI Creatinine Equation (2021)    Anion gap 7 5 - 15    Comment: Performed at [facility], [address] [place], [place] [address]  Lipase, blood     Status: None   Collection Time: 10/20/22  9:23 AM  Result Value Ref Range   Lipase 35 11 - 51 U/L    Comment: Performed at [facility], [address] [place], [place] [address]  SARS Coronavirus 2 by RT PCR (hospital order, performed in [facility] hospital lab) *cepheid single result test* Anterior Nasal Swab     Status: Abnormal   Collection Time: 10/20/22 12:02 PM   Specimen: Anterior Nasal Swab  Result Value Ref Range   SARS Coronavirus 2 by RT PCR POSITIVE (A) NEGATIVE    Comment: (NOTE) SARS-CoV-2 target nucleic acids are DETECTED  SARS-CoV-2 RNA is generally detectable in upper respiratory specimens  during the acute phase of infection.  Positive results are indicative  of the presence of the identified virus, but do not rule out bacterial infection or co-infection with other pathogens not detected by the test.  Clinical correlation with patient history and  other diagnostic information is necessary to determine patient infection status.  The expected result is negative.  Fact Sheet for Patients:   RoadLapTop.co.za   Fact Sheet for Healthcare Providers:   http://kim-miller.com/    This test is not yet approved or cleared by the [place] FDA and  has been  authorized for detection and/or  diagnosis of SARS-CoV-2 by FDA under an Emergency Use Authorization (EUA).  This EUA will remain in effect (meaning this test can be used) for the duration of  the COVID-19 declaration under Section 564(b)(1)  of the Act, 21 U.S.C. section 360-bbb-3(b)(1), unless the authorization is terminated or revoked sooner.   Performed at [facility], [address] [place], [place] [address]   Acetaminophen level     Status: None   Collection Time: 10/20/22 12:02 PM  Result Value Ref Range   Acetaminophen (Tylenol), Serum 17 10 - 30 ug/mL    Comment: (NOTE) Therapeutic concentrations vary significantly. A range of 10-30 ug/mL  may be an effective concentration for many patients. However, some  are best treated at concentrations outside of this range. Acetaminophen concentrations >150 ug/mL at 4 hours after ingestion  and >50 ug/mL at 12 hours after ingestion are often associated with  toxic reactions.  Performed at [facility], [address] [place], [place] [address]   Hepatitis panel, acute     Status: None   Collection Time: 10/20/22 12:02 PM  Result Value Ref Range   Hepatitis B Surface Ag NON REACTIVE NON REACTIVE   HCV Ab NON REACTIVE NON REACTIVE    Comment: (NOTE) Nonreactive HCV antibody screen is consistent with no HCV infections,  unless recent infection is suspected or other evidence exists to indicate HCV infection.     Hep A IgM NON REACTIVE NON REACTIVE   Hep B C IgM NON REACTIVE NON REACTIVE    Comment: Performed at [facility], [address] [place], [place] [address]  Protime-INR     Status: None   Collection Time: 10/20/22 12:02 PM  Result Value Ref Range   Prothrombin Time 13.8 11.4 - 15.2 seconds   INR 1.0 0.8 - 1.2    Comment: (NOTE) INR goal varies based on device and disease states. Performed at [facility], [address] [place], [place] [address]   Bilirubin, fractionated(tot/dir/indir)     Status:  Abnormal   Collection Time: 10/20/22  8:55 PM  Result Value Ref Range   Total Bilirubin 3.4 (H) 0.3 - 1.2 mg/dL   Bilirubin, Direct 0.6 (H) 0.0 - 0.2 mg/dL   Indirect Bilirubin 2.8 (H) 0.3 - 0.9 mg/dL    Comment: Performed at [facility], [address] [place], [place] [address]  Ammonia     Status: None   Collection Time: 10/20/22  9:18 PM  Result Value Ref Range   Ammonia 13 9 - 35 umol/L    Comment: Performed at [facility], [address], [place], [place] [address]    US Abdomen Limited RUQ (LIVER/GB)  Result Date: 10/20/2022 CLINICAL DATA:  Right upper quadrant pain for the past week with nausea, vomiting, and diarrhea. EXAM: ULTRASOUND ABDOMEN LIMITED RIGHT UPPER QUADRANT COMPARISON:  CT abdomen pelvis from same day. FINDINGS: Gallbladder: Multiple layering tiny gallstones with mild wall thickening and trace pericholecystic fluid. Positive sonographic Murphy sign noted by sonographer. Common bile duct: Diameter: 7 mm, at the upper limits of normal. 4 mm gallstone seen within the common bile duct near the pancreatic head. Liver: 1.6 x 1.2 x 1.1 cm echogenic lesion in the left lateral liver, characterized as a hemangioma on today's CT scan. Within normal limits in parenchymal echogenicity. Portal vein is patent on color Doppler imaging with normal direction of blood flow towards  the liver. Other: None. IMPRESSION: 1. Findings concerning for acute cholecystitis. 2. Choledocholithiasis without significant biliary ductal dilatation. Electronically Signed   By: Obie Dredge M.D.   On: 10/20/2022 12:18   CT ABDOMEN PELVIS W CONTRAST  Result Date: 10/20/2022 CLINICAL DATA:  Abdominal pain with vomiting and diarrhea. EXAM: CT ABDOMEN AND PELVIS WITH CONTRAST TECHNIQUE: Multidetector CT imaging of the abdomen and pelvis was performed using the standard protocol following bolus administration of intravenous contrast. RADIATION DOSE REDUCTION: This exam was  performed according to the departmental dose-optimization program which includes automated exposure control, adjustment of the mA and/or kV according to patient size and/or use of iterative reconstruction technique. CONTRAST:  OMNIPAQUE IOHEXOL 300 MG/ML  SOLN COMPARISON:  05/28/2019 FINDINGS: Lower chest: Unremarkable. Hepatobiliary: 1.7 cm lesion in the lateral segment left liver (image 9/2) shows peripheral nodular enhancement inferiorly (11/2 and coronal 72/5) consistent with benign cavernous hemangioma. No followup imaging is recommended. Gallbladder is distended with very subtle pericholecystic edema evident (image 30/2 and coronal 58/5). No calcified gallstones evident. No intrahepatic or extrahepatic biliary dilation. Pancreas: No focal mass lesion. No dilatation of the main duct. No intraparenchymal cyst. No peripancreatic edema. Spleen: No splenomegaly. No suspicious focal mass lesion. Adrenals/Urinary Tract: No adrenal nodule or mass. Kidneys unremarkable. No evidence for hydroureter. The urinary bladder appears normal for the degree of distention. Stomach/Bowel: Stomach is unremarkable. No gastric wall thickening. No evidence of outlet obstruction. Duodenum is normally positioned as is the ligament of Treitz. No small bowel wall thickening. No small bowel dilatation. The terminal ileum is normal. The appendix is normal. No gross colonic mass. No colonic wall thickening. Vascular/Lymphatic: No abdominal aortic aneurysm. No abdominal aortic atherosclerotic calcification. Portal vein superior mesenteric vein are patent. There is no gastrohepatic or hepatoduodenal ligament lymphadenopathy. No retroperitoneal or mesenteric lymphadenopathy. No pelvic sidewall lymphadenopathy. Reproductive: Unremarkable. Other: Trace free fluid seen in the cul-de-sac. Musculoskeletal: No worrisome lytic or sclerotic osseous abnormality. IMPRESSION: 1. Distended gallbladder with very subtle pericholecystic edema. No  calcified gallstones evident. Right upper quadrant ultrasound could be used to further evaluate as clinically warranted. 2. Trace free fluid in the cul-de-sac, likely physiologic. Electronically Signed   By: Kennith Center M.D.   On: 10/20/2022 10:54    ROS - all of the below systems have been reviewed with the patient and positives are indicated with bold text General: chills, fever or night sweats Eyes: blurry vision or double vision ENT: epistaxis or sore throat Allergy/Immunology: itchy/watery eyes or nasal congestion Hematologic/Lymphatic: bleeding problems, blood clots or swollen lymph nodes Endocrine: temperature intolerance or unexpected weight changes Breast: new or changing breast lumps or nipple discharge Resp: cough, shortness of breath, or wheezing CV: chest pain or dyspnea on exertion GI: as per HPI GU: dysuria, trouble voiding, or hematuria MSK: joint pain or joint stiffness Neuro: TIA or stroke symptoms Derm: pruritus and skin lesion changes Psych: anxiety and depression  PE Blood pressure 110/71, pulse 69, temperature 98.6 F (37 C), resp. rate 18, height 5\' 6"  (1.676 m), weight 59.7 kg, last menstrual period 09/26/2022, SpO2 100%, not currently breastfeeding. Constitutional: NAD; conversant; no deformities Eyes: Moist conjunctiva; no lid lag; anicteric; PERRL Neck: Trachea midline; no thyromegaly Lungs: Normal respiratory effort; no tactile fremitus CV: RRR; no palpable thrills; no pitting edema GI: Abd soft MSK: Normal range of motion of extremities; no clubbing/cyanosis Psychiatric: Appropriate affect; alert and oriented x3 Lymphatic: No palpable cervical or axillary lymphadenopathy  Results for orders placed or performed during  the hospital encounter of 10/20/22 (from the past 48 hour(s))  Pregnancy, urine     Status: None   Collection Time: 10/20/22  8:18 AM  Result Value Ref Range   Preg Test, Ur NEGATIVE NEGATIVE    Comment:        THE SENSITIVITY OF  THIS METHODOLOGY IS >25 mIU/mL. Performed at [facility], [address] [place], [place] [address]   Urinalysis, w/ Reflex to Culture (Infection Suspected) -Urine, Clean Catch     Status: Abnormal   Collection Time: 10/20/22  8:18 AM  Result Value Ref Range   Specimen Source URINE, CLEAN CATCH    Color, Urine YELLOW YELLOW   APPearance HAZY (A) CLEAR   Specific Gravity, Urine 1.020 1.005 - 1.030   pH 8.5 (H) 5.0 - 8.0   Glucose, UA 100 (A) NEGATIVE mg/dL   Hgb urine dipstick NEGATIVE NEGATIVE   Bilirubin Urine SMALL (A) NEGATIVE   Ketones, ur NEGATIVE NEGATIVE mg/dL   Protein, ur 629 (A) NEGATIVE mg/dL   Nitrite NEGATIVE NEGATIVE   Leukocytes,Ua NEGATIVE NEGATIVE   Squamous Epithelial / HPF 6-10 0 - 5 /HPF   WBC, UA 0-5 0 - 5 WBC/hpf    Comment: Reflex urine culture not performed if WBC <=10, OR if Squamous epithelial cells >5. If Squamous epithelial cells >5, suggest recollection.   RBC / HPF 0-5 0 - 5 RBC/hpf   Bacteria, UA MANY (A) NONE SEEN   Mucus PRESENT     Comment: Performed at [facility], [address] [place], [place] [address]  CBC with Differential     Status: None   Collection Time: 10/20/22  9:23 AM  Result Value Ref Range   WBC 5.9 4.0 - 10.5 K/uL   RBC 4.30 3.87 - 5.11 MIL/uL   Hemoglobin 12.9 12.0 - 15.0 g/dL   HCT 32.4 40.1 - 02.7 %   MCV 90.0 80.0 - 100.0 fL   MCH 30.0 26.0 - 34.0 pg   MCHC 33.3 30.0 - 36.0 g/dL   RDW 25.3 66.4 - 40.3 %   Platelets 278 150 - 400 K/uL   nRBC 0.0 0.0 - 0.2 %   Neutrophils Relative % 81 %   Neutro Abs 4.7 1.7 - 7.7 K/uL   Lymphocytes Relative 13 %   Lymphs Abs 0.8 0.7 - 4.0 K/uL   Monocytes Relative 6 %   Monocytes Absolute 0.4 0.1 - 1.0 K/uL   Eosinophils Relative 0 %   Eosinophils Absolute 0.0 0.0 - 0.5 K/uL   Basophils Relative 0 %   Basophils Absolute 0.0 0.0 - 0.1 K/uL   Immature Granulocytes 0 %   Abs Immature Granulocytes 0.02 0.00 - 0.07 K/uL    Comment: Performed at [facility], [address] [place], [place] [address]  Comprehensive metabolic panel     Status: Abnormal   Collection Time: 10/20/22  9:23 AM  Result Value Ref Range   Sodium 134 (L) 135 - 145 mmol/L   Potassium 3.8 3.5 - 5.1 mmol/L   Chloride 104 98 - 111 mmol/L   CO2 23 22 - 32 mmol/L   Glucose, Bld 108 (H) 70 - 99 mg/dL    Comment: Glucose reference range applies only to samples taken after fasting for at least 8 hours.   BUN 11 6 - 20 mg/dL   Creatinine, Ser 9.56 0.44 - 1.00 mg/dL   Calcium 9.8 8.9 - 38.7 mg/dL  Total Protein 6.8 6.5 - 8.1 g/dL   Albumin 3.9 3.5 - 5.0 g/dL   AST 1,610 (H) 15 - 41 U/L   ALT 948 (H) 0 - 44 U/L   Alkaline Phosphatase 66 38 - 126 U/L   Total Bilirubin 2.1 (H) 0.3 - 1.2 mg/dL   GFR, Estimated >96 >04 mL/min    Comment: (NOTE) Calculated using the CKD-EPI Creatinine Equation (2021)    Anion gap 7 5 - 15    Comment: Performed at [facility], [address] [place], [place] [address]  Lipase, blood     Status: None   Collection Time: 10/20/22  9:23 AM  Result Value Ref Range   Lipase 35 11 - 51 U/L    Comment: Performed at [facility], [address] [place], [place] [address]  SARS Coronavirus 2 by RT PCR (hospital order, performed in [facility] hospital lab) *cepheid single result test* Anterior Nasal Swab     Status: Abnormal   Collection Time: 10/20/22 12:02 PM   Specimen: Anterior Nasal Swab  Result Value Ref Range   SARS Coronavirus 2 by RT PCR POSITIVE (A) NEGATIVE    Comment: (NOTE) SARS-CoV-2 target nucleic acids are DETECTED  SARS-CoV-2 RNA is generally detectable in upper respiratory specimens  during the acute phase of infection.  Positive results are indicative  of the presence of the identified virus, but do not rule out bacterial infection or co-infection with other pathogens not detected by the test.  Clinical correlation with patient history and  other diagnostic information is necessary to  determine patient infection status.  The expected result is negative.  Fact Sheet for Patients:   RoadLapTop.co.za   Fact Sheet for Healthcare Providers:   http://kim-miller.com/    This test is not yet approved or cleared by the [place] FDA and  has been authorized for detection and/or diagnosis of SARS-CoV-2 by FDA under an Emergency Use Authorization (EUA).  This EUA will remain in effect (meaning this test can be used) for the duration of  the COVID-19 declaration under Section 564(b)(1)  of the Act, 21 U.S.C. section 360-bbb-3(b)(1), unless the authorization is terminated or revoked sooner.   Performed at [facility], [address] [place], [place] [address]   Acetaminophen level     Status: None   Collection Time: 10/20/22 12:02 PM  Result Value Ref Range   Acetaminophen (Tylenol), Serum 17 10 - 30 ug/mL    Comment: (NOTE) Therapeutic concentrations vary significantly. A range of 10-30 ug/mL  may be an effective concentration for many patients. However, some  are best treated at concentrations outside of this range. Acetaminophen concentrations >150 ug/mL at 4 hours after ingestion  and >50 ug/mL at 12 hours after ingestion are often associated with  toxic reactions.  Performed at [facility], [address] [place], [place] [address]   Hepatitis panel, acute     Status: None   Collection Time: 10/20/22 12:02 PM  Result Value Ref Range   Hepatitis B Surface Ag NON REACTIVE NON REACTIVE   HCV Ab NON REACTIVE NON REACTIVE    Comment: (NOTE) Nonreactive HCV antibody screen is consistent with no HCV infections,  unless recent infection is suspected or other evidence exists to indicate HCV infection.     Hep A IgM NON REACTIVE NON REACTIVE   Hep B C IgM NON REACTIVE NON REACTIVE    Comment: Performed at [facility], [address] [place], [place] [address]  Protime-INR     Status:  None   Collection Time: 10/20/22 12:02 PM  Result Value Ref Range   Prothrombin Time 13.8 11.4 - 15.2 seconds   INR 1.0 0.8 - 1.2    Comment: (NOTE) INR goal varies based on device and disease states. Performed at [facility], [address] [place], [place] [address]   Bilirubin, fractionated(tot/dir/indir)     Status: Abnormal   Collection Time: 10/20/22  8:55 PM  Result Value Ref Range   Total Bilirubin 3.4 (H) 0.3 - 1.2 mg/dL   Bilirubin, Direct 0.6 (H) 0.0 - 0.2 mg/dL   Indirect Bilirubin 2.8 (H) 0.3 - 0.9 mg/dL    Comment: Performed at [facility], [address], [place], [place] [address]  Ammonia     Status: None   Collection Time: 10/20/22  9:18 PM  Result Value Ref Range   Ammonia 13 9 - 35 umol/L    Comment: Performed at [facility], [address] [place], [place] [address]    US Abdomen Limited RUQ (LIVER/GB)  Result Date: 10/20/2022 CLINICAL DATA:  Right upper quadrant pain for the past week with nausea, vomiting, and diarrhea. EXAM: ULTRASOUND ABDOMEN LIMITED RIGHT UPPER QUADRANT COMPARISON:  CT abdomen pelvis from same day. FINDINGS: Gallbladder: Multiple layering tiny gallstones with mild wall thickening and trace pericholecystic fluid. Positive sonographic Murphy sign noted by sonographer. Common bile duct: Diameter: 7 mm, at the upper limits of normal. 4 mm gallstone seen within the common bile duct near the pancreatic head. Liver: 1.6 x 1.2 x 1.1 cm echogenic lesion in the left lateral liver, characterized as a hemangioma on today's CT scan. Within normal limits in parenchymal echogenicity. Portal vein is patent on color Doppler imaging with normal direction of blood flow towards the liver. Other: None. IMPRESSION: 1. Findings concerning for acute cholecystitis. 2. Choledocholithiasis without significant biliary ductal dilatation. Electronically Signed   By: Obie Dredge M.D.   On: 10/20/2022 12:18   CT ABDOMEN  PELVIS W CONTRAST  Result Date: 10/20/2022 CLINICAL DATA:  Abdominal pain with vomiting and diarrhea. EXAM: CT ABDOMEN AND PELVIS WITH CONTRAST TECHNIQUE: Multidetector CT imaging of the abdomen and pelvis was performed using the standard protocol following bolus administration of intravenous contrast. RADIATION DOSE REDUCTION: This exam was performed according to the departmental dose-optimization program which includes automated exposure control, adjustment of the mA and/or kV according to patient size and/or use of iterative reconstruction technique. CONTRAST:  OMNIPAQUE IOHEXOL 300 MG/ML  SOLN COMPARISON:  05/28/2019 FINDINGS: Lower chest: Unremarkable. Hepatobiliary: 1.7 cm lesion in the lateral segment left liver (image 9/2) shows peripheral nodular enhancement inferiorly (11/2 and coronal 72/5) consistent with benign cavernous hemangioma. No followup imaging is recommended. Gallbladder is distended with very subtle pericholecystic edema evident (image 30/2 and coronal 58/5). No calcified gallstones evident. No intrahepatic or extrahepatic biliary dilation. Pancreas: No focal mass lesion. No dilatation of the main duct. No intraparenchymal cyst. No peripancreatic edema. Spleen: No splenomegaly. No suspicious focal mass lesion. Adrenals/Urinary Tract: No adrenal nodule or mass. Kidneys unremarkable. No evidence for hydroureter. The urinary bladder appears normal for the degree of distention. Stomach/Bowel: Stomach is unremarkable. No gastric wall thickening. No evidence of outlet obstruction. Duodenum is normally positioned as is the ligament of Treitz. No small bowel wall thickening. No small bowel dilatation. The terminal ileum is normal. The appendix is normal. No gross  colonic mass. No colonic wall thickening. Vascular/Lymphatic: No abdominal aortic aneurysm. No abdominal aortic atherosclerotic calcification. Portal vein superior mesenteric vein are patent. There is no gastrohepatic or hepatoduodenal  ligament lymphadenopathy. No retroperitoneal or mesenteric lymphadenopathy. No pelvic sidewall lymphadenopathy. Reproductive: Unremarkable. Other: Trace free fluid seen in the cul-de-sac. Musculoskeletal: No worrisome lytic or sclerotic osseous abnormality. IMPRESSION: 1. Distended gallbladder with very subtle pericholecystic edema. No calcified gallstones evident. Right upper quadrant ultrasound could be used to further evaluate as clinically warranted. 2. Trace free fluid in the cul-de-sac, likely physiologic. Electronically Signed   By: Kennith Center M.D.   On: 10/20/2022 10:54    I have personally reviewed the relevant CT scan, [place] report and lab values.    A/P: Beverly Stewart is an 37 y.o. female with abd pain, nausea, vomiting and diarrhea.   RUQ concerning for choledocholithiasis with mild wall thickening and trace pericholecystic fluid.  Will await MRCP.  Transaminate pattern seems to be more of a primary liver issue.  Will follow.  Cont abx.  No surgical plans for today.  I spent a total of 67 minutes in both face-to-face and non-face-to-face activities, excluding procedures performed, for this visit on the date of this encounter.  Vanita Panda, MD  Colorectal and General Surgery [facility]

## 2022-10-21 NOTE — Consult Note (Addendum)
Referring Provider: EDP Primary Care Physician:  [facility] Primary Gastroenterologist:  Dr. Myrtie Neither  Reason for Consultation:  ? CBD stones, elevated LFTs  HPI: Beverly Stewart is a 37 y.o. female with limited past medical history, but known to our office for history of IBS.  She presented to [facility] with sudden onset of mid-abdominal pain, nausea, vomiting, and diarrhea that began on Friday.  Says that the pain and diarrhea seemed consistent with previous symptoms that she's had with her IBS but not the nausea and vomiting.    Labs revealed elevated LFTs with an AST of 1278, ALT 948, normal alk phos, total bili of 2.1.  Found to be Covid positive.    CT scan abdomen and pelvis with contrast: IMPRESSION: 1. Distended gallbladder with very subtle pericholecystic edema. No calcified gallstones evident. Right upper quadrant ultrasound could be used to further evaluate as clinically warranted. 2. Trace free fluid in the cul-de-sac, likely physiologic.  Right upper quadrant abdominal ultrasound IMPRESSION: 1. Findings concerning for acute cholecystitis. 2. Choledocholithiasis without significant biliary ductal dilatation.  MRI abdomen/MRCP: IMPRESSION: Multiple tiny gallstones in the gallbladder. No choledocholithiasis appreciated on today's examination; a small calculus in the common bile duct identified by prior ultrasound may have passed in the interval. No biliary ductal dilatation.  Today LFTs show a mild decrease in total bilirubin to 2.9, alk phos still remains normal, AST down to 662, but ALT up to 1295.  She is feeling better.  No more diarrhea, nausea, or vomiting.  Pain is better.  Would like to eat.   Past Medical History:  Diagnosis Date   Allergy    Headache    History of chlamydia    Infection    UTI   Seasonal allergies    Vaginal Pap smear, abnormal    colpo, ok since    Past Surgical History:  Procedure Laterality Date   COLONOSCOPY      colposcopy N/A     Prior to Admission medications   Medication Sig Start Date End Date Taking? Authorizing Provider  acetaminophen (TYLENOL) 325 MG tablet Take 2 tablets (650 mg total) by mouth every 6 (six) hours as needed (for pain scale < 4). Patient taking differently: Take 650 mg by mouth as needed for moderate pain. 05/02/21  Yes Charlett Nose, MD  fluticasone (FLONASE) 50 MCG/ACT nasal spray Place 1 spray into both nostrils daily. Begin by using 2 sprays in each nare daily for 3 to 5 days, then decrease to 1 spray in each nare daily. Patient taking differently: Place 1 spray into both nostrils as needed for allergies. 09/17/21  Yes Theadora Rama Scales, PA-C  Naphazoline HCl (CLEAR EYES OP) Place 1-2 drops into both eyes as needed (dry eye).   Yes [provider]  acetaminophen-codeine (TYLENOL #3) 300-30 MG tablet Take 1 tablet by mouth every 6 (six) hours as needed. Patient not taking: Reported on 10/21/2022 10/02/22   [provider]    Current Facility-Administered Medications  Medication Dose Route Frequency Provider Last Rate Last Admin   dextrose 5 % in lactated ringers infusion   Intravenous Continuous Sundil, Subrina, MD 75 mL/hr at 10/21/22 0645 Infusion Verify at 10/21/22 0645   heparin injection 5,000 Units  5,000 Units Subcutaneous Q8H Sundil, Subrina, MD   5,000 Units at 10/21/22 0550   hydrALAZINE (APRESOLINE) injection 10 mg  10 mg Intravenous Q6H PRN Sundil, Subrina, MD       ibuprofen (ADVIL) tablet 200 mg  200 mg Oral Q6H PRN Janalyn Shy, Subrina, MD       morphine (PF) 2 MG/ML injection 2 mg  2 mg Intravenous Q2H PRN Janalyn Shy, Subrina, MD   2 mg at 10/21/22 0550   multivitamin with minerals tablet 1 tablet  1 tablet Oral Daily Janalyn Shy, Subrina, MD       ondansetron [facility]) tablet 4 mg  4 mg Oral Q6H PRN Janalyn Shy, Subrina, MD       Or   ondansetron [facility]) injection 4 mg  4 mg Intravenous Q6H PRN Janalyn Shy, Subrina, MD       piperacillin-tazobactam (ZOSYN)  IVPB 3.375 g  3.375 g Intravenous Q8H Sundil, Subrina, MD 12.5 mL/hr at 10/21/22 0645 Infusion Verify at 10/21/22 0645    Allergies as of 10/20/2022   (No Known Allergies)    Family History  Problem Relation Age of Onset   Diabetes Father    Hypertension Maternal Grandmother    Diabetes Maternal Grandmother    Kidney failure Maternal Grandmother        dialysis   Asthma Maternal Grandmother    Kidney disease Maternal Grandmother    Stomach cancer Maternal Grandmother    Stroke Maternal Grandfather    Colon cancer Maternal Grandfather        mets   Birth defects Maternal Grandfather    Hypertension Mother    Seizures Mother    Breast cancer Other    Esophageal cancer Neg Hx    Rectal cancer Neg Hx     Social History   Socioeconomic History   Marital status: Single    Spouse name: Not on file   Number of children: 0   Years of education: Not on file   Highest education level: Not on file  Occupational History   Occupation: [employer]  Tobacco Use   Smoking status: Former    Current packs/day: 0.00    Types: Cigarettes    Quit date: 04/27/2015    Years since quitting: 7.4   Smokeless tobacco: Never   Tobacco comments:    only tried for a wk  Vaping Use   Vaping status: Never Used  Substance and Sexual Activity   Alcohol use: Yes    Comment: rare   Drug use: No   Sexual activity: Yes    Birth control/protection: Condom  Other Topics Concern   Not on file  Social History Narrative   Not on file   Social Determinants of Health   Financial Resource Strain: Low Risk  (07/05/2021)   Received from [facility], [facility]   Overall Financial Resource Strain (CARDIA)    Difficulty of Paying Living Expenses: Not hard at all  Food Insecurity: No Food Insecurity (07/05/2021)   Received from [facility], [facility]   Hunger Vital Sign    Worried About Running Out of Food in the Last Year: Never true    Ran Out of Food in the Last Year: Never true  Transportation  Needs: Not on file  Physical Activity: Unknown (07/05/2021)   Received from [facility], [facility]   Exercise Vital Sign    Days of Exercise per Week: 4 days    Minutes of Exercise per Session: Not on file  Stress: No Stress Concern Present (07/05/2021)   Received from [facility], [facility]   [employer] of Occupational Health - Occupational Stress Questionnaire    Feeling of Stress : Only a little  Social Connections: Unknown (07/06/2022)   Received from [facility]  Social Network    Social Network: Not on file  Intimate Partner Violence: Unknown (07/06/2022)   Received from [facility]   HITS    Physically Hurt: Not on file    Insult or Talk Down To: Not on file    Threaten Physical Harm: Not on file    Scream or Curse: Not on file    Review of Systems: ROS is O/W negative except as mentioned in HPI.  Physical Exam: Vital signs in last 24 hours: Temp:  [97.8 F (36.6 C)-99.3 F (37.4 C)] 98.6 F (37 C) (08/04 0701) Pulse Rate:  [58-74] 69 (08/04 0701) Resp:  [16-18] 18 (08/04 0701) BP: (109-132)/(60-82) 110/71 (08/04 0701) SpO2:  [96 %-100 %] 100 % (08/04 0701) Last BM Date : 10/19/22 General:  Alert, Well-developed, well-nourished, pleasant and cooperative in NAD Head:  Normocephalic and atraumatic. Eyes:  Sclera clear, no icterus.  Conjunctiva pink. Ears:  Normal auditory acuity. Mouth:  No deformity or lesions.   Lungs:  Clear throughout to auscultation.  No wheezes, crackles, or rhonchi.  Heart:  Regular rate and rhythm; no murmurs, clicks, rubs,  or gallops. Abdomen:  Soft, non-distended.  BS present.  Non-tender.   Msk:  Symmetrical without gross deformities. Pulses:  Normal pulses noted. Extremities:  Without clubbing or edema. Neurologic:  Alert and oriented x 4;  grossly normal neurologically. Skin:  Intact without significant lesions or rashes. Psych:  Alert and cooperative. Normal mood and affect.  Intake/Output from previous  day: 08/03 0701 - 08/04 0700 In: 1838.2 [I.V.:734.5; IV Piggyback:1103.7] Out: -   Lab Results: Recent Labs    10/20/22 0923 10/21/22 0735  WBC 5.9 3.8*  HGB 12.9 12.4  HCT 38.7 38.2  PLT 278 267   BMET Recent Labs    10/20/22 0923  NA 134*  K 3.8  CL 104  CO2 23  GLUCOSE 108*  BUN 11  CREATININE 0.76  CALCIUM 9.8   LFT Recent Labs    10/20/22 0923 10/20/22 2055  PROT 6.8  --   ALBUMIN 3.9  --   AST 1,270*  --   ALT 948*  --   ALKPHOS 66  --   BILITOT 2.1* 3.4*  BILIDIR  --  0.6*  IBILI  --  2.8*   PT/INR Recent Labs    10/20/22 1202 10/21/22 0735  LABPROT 13.8 14.6  INR 1.0 1.1   Hepatitis Panel Recent Labs    10/20/22 1202  HEPBSAG NON REACTIVE  HCVAB NON REACTIVE  HEPAIGM NON REACTIVE  HEPBIGM NON REACTIVE    Studies/Results: US Abdomen Limited RUQ (LIVER/GB)  Result Date: 10/20/2022 CLINICAL DATA:  Right upper quadrant pain for the past week with nausea, vomiting, and diarrhea. EXAM: ULTRASOUND ABDOMEN LIMITED RIGHT UPPER QUADRANT COMPARISON:  CT abdomen pelvis from same day. FINDINGS: Gallbladder: Multiple layering tiny gallstones with mild wall thickening and trace pericholecystic fluid. Positive sonographic Murphy sign noted by sonographer. Common bile duct: Diameter: 7 mm, at the upper limits of normal. 4 mm gallstone seen within the common bile duct near the pancreatic head. Liver: 1.6 x 1.2 x 1.1 cm echogenic lesion in the left lateral liver, characterized as a hemangioma on today's CT scan. Within normal limits in parenchymal echogenicity. Portal vein is patent on color Doppler imaging with normal direction of blood flow towards the liver. Other: None. IMPRESSION: 1. Findings concerning for acute cholecystitis. 2. Choledocholithiasis without significant biliary ductal dilatation. Electronically Signed   By: Vickki Hearing.D.  On: 10/20/2022 12:18   CT ABDOMEN PELVIS W CONTRAST  Result Date: 10/20/2022 CLINICAL DATA:  Abdominal pain with  vomiting and diarrhea. EXAM: CT ABDOMEN AND PELVIS WITH CONTRAST TECHNIQUE: Multidetector CT imaging of the abdomen and pelvis was performed using the standard protocol following bolus administration of intravenous contrast. RADIATION DOSE REDUCTION: This exam was performed according to the departmental dose-optimization program which includes automated exposure control, adjustment of the mA and/or kV according to patient size and/or use of iterative reconstruction technique. CONTRAST:  OMNIPAQUE IOHEXOL 300 MG/ML  SOLN COMPARISON:  05/28/2019 FINDINGS: Lower chest: Unremarkable. Hepatobiliary: 1.7 cm lesion in the lateral segment left liver (image 9/2) shows peripheral nodular enhancement inferiorly (11/2 and coronal 72/5) consistent with benign cavernous hemangioma. No followup imaging is recommended. Gallbladder is distended with very subtle pericholecystic edema evident (image 30/2 and coronal 58/5). No calcified gallstones evident. No intrahepatic or extrahepatic biliary dilation. Pancreas: No focal mass lesion. No dilatation of the main duct. No intraparenchymal cyst. No peripancreatic edema. Spleen: No splenomegaly. No suspicious focal mass lesion. Adrenals/Urinary Tract: No adrenal nodule or mass. Kidneys unremarkable. No evidence for hydroureter. The urinary bladder appears normal for the degree of distention. Stomach/Bowel: Stomach is unremarkable. No gastric wall thickening. No evidence of outlet obstruction. Duodenum is normally positioned as is the ligament of Treitz. No small bowel wall thickening. No small bowel dilatation. The terminal ileum is normal. The appendix is normal. No gross colonic mass. No colonic wall thickening. Vascular/Lymphatic: No abdominal aortic aneurysm. No abdominal aortic atherosclerotic calcification. Portal vein superior mesenteric vein are patent. There is no gastrohepatic or hepatoduodenal ligament lymphadenopathy. No retroperitoneal or mesenteric lymphadenopathy. No  pelvic sidewall lymphadenopathy. Reproductive: Unremarkable. Other: Trace free fluid seen in the cul-de-sac. Musculoskeletal: No worrisome lytic or sclerotic osseous abnormality. IMPRESSION: 1. Distended gallbladder with very subtle pericholecystic edema. No calcified gallstones evident. Right upper quadrant ultrasound could be used to further evaluate as clinically warranted. 2. Trace free fluid in the cul-de-sac, likely physiologic. Electronically Signed   By: Kennith Center M.D.   On: 10/20/2022 10:54    IMPRESSION:  *37 year old female presenting with complaints of abdominal pain, nausea, vomiting and diarrhea.  Labs revealed elevated LFTs with an AST of 1278, ALT 948, normal alk phos, total bili of 2.1.  CT scan showed some subtle pericholecystic edema then ultrasound showed findings concerning for acute cholecystitis and choledocholithiasis without significant biliary ductal dilatation.  This is there is a 4 mm gallstone seen within the common bile duct near the pancreatic head.  Acute viral hepatitis panel was negative.  MRI abdomen/MRCP actually shows multiple tiny gallstones in the gallbladder, but no choledocholithiasis.  LFTs today show a mild decrease in total bilirubin and decrease in AST, but increase in ALT.  The pattern of elevated LFTs does not necessarily 100% fit with cholecystitis and choledocholithiasis.  Question if a component of it could be reactive due to due COVID infection from COVID itself. *Covid 19  PLAN: -Trend LFTs. -Several other labs for liver issues including anti-smooth muscle antibody, HSV, EBV, CMV, varicella-zoster, ANA, ceruloplasmin, and AMA were all ordered by the admitting hospitalist..  I will add the remaining liver labs as well as the pattern of elevated LFTs does not necessarily fit with cholecystitis and choledocholithiasis. -Soft diet. -No need for ERCP as MRCP is negative.   Princella Pellegrini. Zehr  10/21/2022, 8:55 AM  GI ATTENDING  History, laboratories,  x-rays reviewed.  Patient seen and examined.  Agree with comprehensive consultation note  as outlined above.  37 year old presents with acute abdominal pain and elevated liver tests.  Normal alkaline phosphatase.  Imaging demonstrates gallstones and suggests cholecystitis.  MRI does not show choledocholithiasis.  She is COVID-positive.  Balance of the evidence suggests calculus cholecystitis.  Given the degree of transaminitis, other concurrent entities are being explored.  Agree with antibiotics.  Appreciate surgical opinion.  Need to monitor.  I suspect that she will benefit from laparoscopic cholecystectomy when deemed appropriate by general surgery.  GI will follow-up tomorrow.  Wilhemina Bonito. Eda Keys., M.D. [facility] Division of Gastroenterology

## 2022-10-21 NOTE — Plan of Care (Signed)
  Problem: Education: Goal: Knowledge of General Education information will improve Description: Including pain rating scale, medication(s)/side effects and non-pharmacologic comfort measures 10/21/2022 1352 by Cathlean Cower, RN Outcome: Progressing 10/21/2022 1340 by Tevita Gomer, Stormy Card, RN Outcome: Progressing   Problem: Health Behavior/Discharge Planning: Goal: Ability to manage health-related needs will improve 10/21/2022 1352 by Nicklous Aburto, Stormy Card, RN Outcome: Progressing 10/21/2022 1340 by Clio Gerhart, Stormy Card, RN Outcome: Progressing   Problem: Clinical Measurements: Goal: Ability to maintain clinical measurements within normal limits will improve Outcome: Progressing   Problem: Activity: Goal: Risk for activity intolerance will decrease Outcome: Progressing   Problem: Elimination: Goal: Will not experience complications related to bowel motility Outcome: Progressing

## 2022-10-21 NOTE — Hospital Course (Addendum)
Beverly Stewart is a 37 yo female with no pertinent PMH who presented to [facility] with abdominal pain and N/V/D for approximately ~2 days prior to admission.  Multiple imaging studies performed including CT A/P, RUQ u/s, and MRCP.  Ultimately found to have cholelithiasis but no choledocholithiasis. The ultrasound was notable with GB wall thickening and trace pericholecysitic fluid.  Initial labs notable with Alk phos 66, AST 1,270, ALT 948, and TB 2.1.   She was started on IVF, zosyn, and transferred for GI and general surgery evaluation. LFTs did show some downtrending with serial monitoring.  Autoimmune and further workup was commenced by GI as well. She was considered to have probable symptomatic cholelithiasis without cholecystitis.  She was considered stable for discharge home with outpatient follow-up with general surgery for elective cholecystectomy.

## 2022-10-21 NOTE — Assessment & Plan Note (Addendum)
-   Mild gallbladder wall thickening and trace pericholecystic fluid noted on ultrasound concerning for acute cholecystitis however symptoms have improved since admission which possibly supports a passed stone -Lipase normal - General surgery following, no plans for surgery at this time.  Possibly can be done outpatient electively; follow-up further recommendations -No leukocytosis nor fever.  Continue Zosyn for now

## 2022-10-21 NOTE — Assessment & Plan Note (Signed)
-   elevated on admission with associated N/V/D and abd pain - AST 1270 >> 662 - ALT 948 >> 1295 - TB 2.1 >> 3.4 >> 2.5 - ALP 66 >>85 - no choledoco on MRCP but may have passed a stone and LFTs lagging, but ALT >1000 does raise concern for liver pathology  - GI has been consulted and further AI and infectious workup underway; labs pending at discharge and can be followed up outpatient - Hep panel negative  -LFTs downtrending at discharge

## 2022-10-22 ENCOUNTER — Encounter: Payer: Self-pay | Admitting: Internal Medicine

## 2022-10-22 DIAGNOSIS — K859 Acute pancreatitis without necrosis or infection, unspecified: Secondary | ICD-10-CM | POA: Diagnosis not present

## 2022-10-22 DIAGNOSIS — R7401 Elevation of levels of liver transaminase levels: Secondary | ICD-10-CM | POA: Diagnosis not present

## 2022-10-22 DIAGNOSIS — R7989 Other specified abnormal findings of blood chemistry: Secondary | ICD-10-CM

## 2022-10-22 DIAGNOSIS — K8020 Calculus of gallbladder without cholecystitis without obstruction: Secondary | ICD-10-CM | POA: Diagnosis not present

## 2022-10-22 DIAGNOSIS — U071 COVID-19: Secondary | ICD-10-CM | POA: Diagnosis not present

## 2022-10-22 DIAGNOSIS — R197 Diarrhea, unspecified: Secondary | ICD-10-CM | POA: Diagnosis not present

## 2022-10-22 DIAGNOSIS — K851 Biliary acute pancreatitis without necrosis or infection: Secondary | ICD-10-CM

## 2022-10-22 LAB — CERULOPLASMIN: Ceruloplasmin: 28.7 mg/dL (ref 19.0–39.0)

## 2022-10-22 LAB — ANA W/REFLEX IF POSITIVE: Anti Nuclear Antibody (ANA): NEGATIVE

## 2022-10-22 LAB — EBV AB TO VIRAL CAPSID AG PNL, IGG+IGM
EBV VCA IgG: 168 U/mL — ABNORMAL HIGH (ref 0.0–17.9)
EBV VCA IgM: 86.1 U/mL — ABNORMAL HIGH (ref 0.0–35.9)

## 2022-10-22 LAB — CMV IGM: CMV IgM: <30 [AU]/ml (ref 0.0–29.9)

## 2022-10-22 LAB — HSV(HERPES SIMPLEX VRS) I + II AB-IGG
HSV 1 Glycoprotein G Ab, IgG: 5.16 {index} — ABNORMAL HIGH (ref 0.00–0.90)
HSV 2 Glycoprotein G Ab, IgG: 2.74 {index} — ABNORMAL HIGH (ref 0.00–0.90)

## 2022-10-22 LAB — ANTI-SMOOTH MUSCLE ANTIBODY, IGG: F-Actin IgG: 10 U (ref 0–19)

## 2022-10-22 NOTE — Discharge Summary (Signed)
Physician Discharge Summary   Beverly Stewart GEX:[id number] DOB: October 30, 1985 DOA: 10/20/2022  PCP: [facility]  Admit date: 10/20/2022 Discharge date: 10/22/2022   Admitted From: Home Disposition:  Home Discharging physician: Lewie Chamber, MD Barriers to discharge: none  Recommendations at discharge: Follow up with general surgery for elective CCY Follow up multiple pending labs from GI workup   Discharge Condition: stable CODE STATUS: Full  Diet recommendation:  Diet Orders (From admission, onward)     Start     Ordered   10/22/22 0000  Diet general        10/22/22 1002   10/21/22 1143  DIET SOFT Room service appropriate? Yes; Fluid consistency: Thin  Diet effective now       Question Answer Comment  Room service appropriate? Yes   Fluid consistency: Thin      10/21/22 1143            Hospital Course: Beverly Stewart is a 37 yo female with no pertinent PMH who presented to [facility] with abdominal pain and N/V/D for approximately ~2 days prior to admission.  Multiple imaging studies performed including CT A/P, RUQ u/s, and MRCP.  Ultimately found to have cholelithiasis but no choledocholithiasis. The ultrasound was notable with GB wall thickening and trace pericholecysitic fluid.  Initial labs notable with Alk phos 66, AST 1,270, ALT 948, and TB 2.1.   She was started on IVF, zosyn, and transferred for GI and general surgery evaluation. LFTs did show some downtrending with serial monitoring.  Autoimmune and further workup was commenced by GI as well. She was considered to have probable symptomatic cholelithiasis without cholecystitis.  She was considered stable for discharge home with outpatient follow-up with general surgery for elective cholecystectomy.  Assessment and Plan: * Symptomatic cholelithiasis - Mild gallbladder wall thickening and trace pericholecystic fluid noted on ultrasound concerning for acute cholecystitis however symptoms have improved since  admission which possibly supports a passed stone -Lipase normal - General surgery following, no plans for surgery at this time.  Plan will be for outpatient follow-up for elective cholecystectomy -Lower suspicion for cholecystitis after surgery evaluation.  No antibiotics continued at discharge  Transaminitis - elevated on admission with associated N/V/D and abd pain - AST 1270 >> 662 - ALT 948 >> 1295 - TB 2.1 >> 3.4 >> 2.5 - ALP 66 >>85 - no choledoco on MRCP but may have passed a stone and LFTs lagging, but ALT >1000 does raise concern for liver pathology  - GI has been consulted and further AI and infectious workup underway; labs pending at discharge and can be followed up outpatient - Hep panel negative  -LFTs downtrending at discharge  Diarrhea-resolved as of 10/22/2022 - Suspect related to cholecystitis/transaminitis -Resolved prior to discharge  COVID-19 virus infection-resolved as of 10/22/2022 - Low suspicion for being symptomatic.  Her symptoms seem more explained by the above process.  Furthermore, she endorses being very symptomatic with COVID approximately in June.  PCR may simply be positive from dead viral shedding - no need for treatment at this time; supportive care - continue precautions - CT value 38, consistent with older and resolving infection   The patient's chronic medical conditions were treated accordingly per the patient's home medication regimen except as noted.  On day of discharge, patient was felt deemed stable for discharge. Patient/family member advised to call PCP or come back to ER if needed.   Principal Diagnosis: Symptomatic cholelithiasis  Discharge Diagnoses: Active Hospital Problems   Diagnosis  Date Noted   Symptomatic cholelithiasis 10/20/2022    Priority: 1.   Transaminitis 10/20/2022    Priority: 1.   Elevated bilirubin 10/20/2022   Cavernous hemangioma of liver 10/20/2022    Resolved Hospital Problems   Diagnosis Date Noted Date  Resolved   Diarrhea 10/20/2022 10/22/2022    Priority: 2.   COVID-19 virus infection 10/21/2022 10/22/2022    Priority: 3.     Discharge Instructions     Diet general   Complete by: As directed    Increase activity slowly   Complete by: As directed       Allergies as of 10/22/2022   No Known Allergies      Medication List     STOP taking these medications    acetaminophen-codeine 300-30 MG tablet Commonly known as: TYLENOL #3       TAKE these medications    acetaminophen 325 MG tablet Commonly known as: Tylenol Take 2 tablets (650 mg total) by mouth every 6 (six) hours as needed (for pain scale < 4). What changed:  when to take this reasons to take this   CLEAR EYES OP Place 1-2 drops into both eyes as needed (dry eye).   fluticasone 50 MCG/ACT nasal spray Commonly known as: FLONASE Place 1 spray into both nostrils daily. Begin by using 2 sprays in each nare daily for 3 to 5 days, then decrease to 1 spray in each nare daily. What changed:  when to take this reasons to take this additional instructions        Follow-up Information     Surgery, [facility]. Schedule an appointment as soon as possible for a visit in 3 week(s).   Specialty: General Surgery Why: to discuss gallbladder surgery Contact information: [address] [place] [address] [phone]                No Known Allergies  Consultations: General surgery GI  Procedures:   Discharge Exam: BP 115/71 (BP Location: Right Arm)   Pulse 88   Temp 98.1 F (36.7 C)   Resp 18   Ht 5\' 6"  (1.676 m)   Wt 59.7 kg   LMP 09/26/2022 (Approximate) Comment: neg preg test  SpO2 98%   BMI 21.24 kg/m  Physical Exam Constitutional:      General: She is not in acute distress.    Appearance: She is well-developed.  HENT:     Head: Normocephalic and atraumatic.     Mouth/Throat:     Mouth: Mucous membranes are moist.  Eyes:     Extraocular Movements:  Extraocular movements intact.  Cardiovascular:     Rate and Rhythm: Normal rate and regular rhythm.  Pulmonary:     Effort: Pulmonary effort is normal.     Breath sounds: Normal breath sounds.  Abdominal:     General: Bowel sounds are normal. There is no distension.     Palpations: Abdomen is soft.  Musculoskeletal:        General: Normal range of motion.     Cervical back: Normal range of motion and neck supple.  Skin:    General: Skin is warm and dry.  Neurological:     General: No focal deficit present.     Mental Status: She is alert and oriented to person, place, and time.  Psychiatric:        Mood and Affect: Mood normal.        Behavior: Behavior normal.  The results of significant diagnostics from this hospitalization (including imaging, microbiology, ancillary and laboratory) are listed below for reference.   Microbiology: Recent Results (from the past 240 hour(s))  SARS Coronavirus 2 by RT PCR (hospital order, performed in [facility] hospital lab) *cepheid single result test* Anterior Nasal Swab     Status: Abnormal   Collection Time: 10/20/22 12:02 PM   Specimen: Anterior Nasal Swab  Result Value Ref Range Status   SARS Coronavirus 2 by RT PCR POSITIVE (A) NEGATIVE Final    Comment: (NOTE) SARS-CoV-2 target nucleic acids are DETECTED  SARS-CoV-2 RNA is generally detectable in upper respiratory specimens  during the acute phase of infection.  Positive results are indicative  of the presence of the identified virus, but do not rule out bacterial infection or co-infection with other pathogens not detected by the test.  Clinical correlation with patient history and  other diagnostic information is necessary to determine patient infection status.  The expected result is negative.  Fact Sheet for Patients:   RoadLapTop.co.za   Fact Sheet for Healthcare Providers:   http://kim-miller.com/    This test is not yet  approved or cleared by the [place] FDA and  has been authorized for detection and/or diagnosis of SARS-CoV-2 by FDA under an Emergency Use Authorization (EUA).  This EUA will remain in effect (meaning this test can be used) for the duration of  the COVID-19 declaration under Section 564(b)(1)  of the Act, 21 U.S.C. section 360-bbb-3(b)(1), unless the authorization is terminated or revoked sooner.   Performed at [facility], [address] [place], [place] [address]      Labs: BNP (last 3 results) No results for input(s): "BNP" in the last 8760 hours. Basic Metabolic Panel: Recent Labs  Lab 10/20/22 0923 10/21/22 0735 10/22/22 0459  NA 134* 137 136  K 3.8 3.9 4.0  CL 104 106 106  CO2 23 23 25   GLUCOSE 108* 98 101*  BUN 11 7 7   CREATININE 0.76 0.96 0.85  CALCIUM 9.8 9.0 9.2  MG  --   --  2.0   Liver Function Tests: Recent Labs  Lab 10/20/22 0923 10/20/22 2055 10/21/22 0735 10/22/22 0459  AST 1,270*  --  662* 219*  ALT 948*  --  1,295* 838*  ALKPHOS 66  --  85 77  BILITOT 2.1* 3.4* 2.9* 1.7*  PROT 6.8  --  6.7 6.6  ALBUMIN 3.9  --  3.5 3.5   Recent Labs  Lab 10/20/22 0923  LIPASE 35   Recent Labs  Lab 10/20/22 2118  AMMONIA 13   CBC: Recent Labs  Lab 10/20/22 0923 10/21/22 0735 10/22/22 0459  WBC 5.9 3.8* 4.5  NEUTROABS 4.7 1.8 2.0  HGB 12.9 12.4 12.2  HCT 38.7 38.2 38.7  MCV 90.0 94.1 93.9  PLT 278 267 257   Cardiac Enzymes: No results for input(s): "CKTOTAL", "CKMB", "CKMBINDEX", "TROPONINI" in the last 168 hours. BNP: Invalid input(s): "POCBNP" CBG: No results for input(s): "GLUCAP" in the last 168 hours. D-Dimer No results for input(s): "DDIMER" in the last 72 hours. Hgb A1c Recent Labs    10/21/22 0735  HGBA1C 5.1   Lipid Profile Recent Labs    10/21/22 0735  CHOL 138  HDL 45  LDLCALC 80  TRIG 65  CHOLHDL 3.1   Thyroid function studies No results for input(s): "TSH", "T4TOTAL", "T3FREE", "THYROIDAB" in  the last 72 hours.  Invalid input(s): "FREET3" Anemia work up [employer]  10/21/22 1255  FERRITIN 75  TIBC 339  IRON 216*   Urinalysis    Component Value Date/Time   COLORURINE YELLOW 10/20/2022 0818   APPEARANCEUR HAZY (A) 10/20/2022 0818   LABSPEC 1.020 10/20/2022 0818   PHURINE 8.5 (H) 10/20/2022 0818   GLUCOSEU 100 (A) 10/20/2022 0818   HGBUR NEGATIVE 10/20/2022 0818   BILIRUBINUR SMALL (A) 10/20/2022 0818   BILIRUBINUR negative 03/06/2022 0905   KETONESUR NEGATIVE 10/20/2022 0818   PROTEINUR 100 (A) 10/20/2022 0818   UROBILINOGEN 1.0 03/06/2022 0905   NITRITE NEGATIVE 10/20/2022 0818   LEUKOCYTESUR NEGATIVE 10/20/2022 0818   Sepsis Labs Recent Labs  Lab 10/20/22 0923 10/21/22 0735 10/22/22 0459  WBC 5.9 3.8* 4.5   Microbiology Recent Results (from the past 240 hour(s))  SARS Coronavirus 2 by RT PCR (hospital order, performed in [facility] hospital lab) *cepheid single result test* Anterior Nasal Swab     Status: Abnormal   Collection Time: 10/20/22 12:02 PM   Specimen: Anterior Nasal Swab  Result Value Ref Range Status   SARS Coronavirus 2 by RT PCR POSITIVE (A) NEGATIVE Final    Comment: (NOTE) SARS-CoV-2 target nucleic acids are DETECTED  SARS-CoV-2 RNA is generally detectable in upper respiratory specimens  during the acute phase of infection.  Positive results are indicative  of the presence of the identified virus, but do not rule out bacterial infection or co-infection with other pathogens not detected by the test.  Clinical correlation with patient history and  other diagnostic information is necessary to determine patient infection status.  The expected result is negative.  Fact Sheet for Patients:   RoadLapTop.co.za   Fact Sheet for Healthcare Providers:   http://kim-miller.com/    This test is not yet approved or cleared by the [place] FDA and  has been authorized for detection and/or  diagnosis of SARS-CoV-2 by FDA under an Emergency Use Authorization (EUA).  This EUA will remain in effect (meaning this test can be used) for the duration of  the COVID-19 declaration under Section 564(b)(1)  of the Act, 21 U.S.C. section 360-bbb-3(b)(1), unless the authorization is terminated or revoked sooner.   Performed at [facility], [address] [place], [place] [address]     Procedures/Studies: MR ABDOMEN MRCP W WO CONTAST  Result Date: 10/21/2022 CLINICAL DATA:  Cholecystitis and choledocholithiasis EXAM: MRI ABDOMEN WITHOUT AND WITH CONTRAST (INCLUDING MRCP) TECHNIQUE: Multiplanar multisequence MR imaging of the abdomen was performed both before and after the administration of intravenous contrast. Heavily T2-weighted images of the biliary and pancreatic ducts were obtained, and three-dimensional MRCP images were rendered by post processing. CONTRAST:  6mL GADAVIST GADOBUTROL 1 MMOL/ML IV SOLN COMPARISON:  Right upper quadrant ultrasound, 10/20/2022, CT abdomen pelvis, 10/20/2022 FINDINGS: Lower chest: No acute abnormality. Hepatobiliary: No solid liver abnormality is seen. Small definitively benign hemangioma of the tip of the left lobe of the liver, requiring no further follow-up or characterization. Multiple tiny gallstones in the gallbladder. No choledocholithiasis appreciated on today's examination. No biliary ductal dilatation Pancreas: Unremarkable. No pancreatic ductal dilatation or surrounding inflammatory changes. Spleen: Normal in size without significant abnormality. Adrenals/Urinary Tract: Adrenal glands are unremarkable. Kidneys are normal, without renal calculi, solid lesion, or hydronephrosis. Stomach/Bowel: Stomach is within normal limits. No evidence of bowel wall thickening, distention, or inflammatory changes. Vascular/Lymphatic: No significant vascular findings are present. No enlarged abdominal lymph nodes. Other: No abdominal wall hernia or abnormality.  No ascites. Musculoskeletal: No acute or significant osseous findings. IMPRESSION:  Multiple tiny gallstones in the gallbladder. No choledocholithiasis appreciated on today's examination; a small calculus in the common bile duct identified by prior ultrasound may have passed in the interval. No biliary ductal dilatation. Electronically Signed   By: Jearld Lesch M.D.   On: 10/21/2022 10:25   MR 3D Recon At Scanner  Result Date: 10/21/2022 CLINICAL DATA:  Cholecystitis and choledocholithiasis EXAM: MRI ABDOMEN WITHOUT AND WITH CONTRAST (INCLUDING MRCP) TECHNIQUE: Multiplanar multisequence MR imaging of the abdomen was performed both before and after the administration of intravenous contrast. Heavily T2-weighted images of the biliary and pancreatic ducts were obtained, and three-dimensional MRCP images were rendered by post processing. CONTRAST:  6mL GADAVIST GADOBUTROL 1 MMOL/ML IV SOLN COMPARISON:  Right upper quadrant ultrasound, 10/20/2022, CT abdomen pelvis, 10/20/2022 FINDINGS: Lower chest: No acute abnormality. Hepatobiliary: No solid liver abnormality is seen. Small definitively benign hemangioma of the tip of the left lobe of the liver, requiring no further follow-up or characterization. Multiple tiny gallstones in the gallbladder. No choledocholithiasis appreciated on today's examination. No biliary ductal dilatation Pancreas: Unremarkable. No pancreatic ductal dilatation or surrounding inflammatory changes. Spleen: Normal in size without significant abnormality. Adrenals/Urinary Tract: Adrenal glands are unremarkable. Kidneys are normal, without renal calculi, solid lesion, or hydronephrosis. Stomach/Bowel: Stomach is within normal limits. No evidence of bowel wall thickening, distention, or inflammatory changes. Vascular/Lymphatic: No significant vascular findings are present. No enlarged abdominal lymph nodes. Other: No abdominal wall hernia or abnormality. No ascites. Musculoskeletal: No acute or  significant osseous findings. IMPRESSION: Multiple tiny gallstones in the gallbladder. No choledocholithiasis appreciated on today's examination; a small calculus in the common bile duct identified by prior ultrasound may have passed in the interval. No biliary ductal dilatation. Electronically Signed   By: Jearld Lesch M.D.   On: 10/21/2022 10:25   US Abdomen Limited RUQ (LIVER/GB)  Result Date: 10/20/2022 CLINICAL DATA:  Right upper quadrant pain for the past week with nausea, vomiting, and diarrhea. EXAM: ULTRASOUND ABDOMEN LIMITED RIGHT UPPER QUADRANT COMPARISON:  CT abdomen pelvis from same day. FINDINGS: Gallbladder: Multiple layering tiny gallstones with mild wall thickening and trace pericholecystic fluid. Positive sonographic Murphy sign noted by sonographer. Common bile duct: Diameter: 7 mm, at the upper limits of normal. 4 mm gallstone seen within the common bile duct near the pancreatic head. Liver: 1.6 x 1.2 x 1.1 cm echogenic lesion in the left lateral liver, characterized as a hemangioma on today's CT scan. Within normal limits in parenchymal echogenicity. Portal vein is patent on color Doppler imaging with normal direction of blood flow towards the liver. Other: None. IMPRESSION: 1. Findings concerning for acute cholecystitis. 2. Choledocholithiasis without significant biliary ductal dilatation. Electronically Signed   By: Obie Dredge M.D.   On: 10/20/2022 12:18   CT ABDOMEN PELVIS W CONTRAST  Result Date: 10/20/2022 CLINICAL DATA:  Abdominal pain with vomiting and diarrhea. EXAM: CT ABDOMEN AND PELVIS WITH CONTRAST TECHNIQUE: Multidetector CT imaging of the abdomen and pelvis was performed using the standard protocol following bolus administration of intravenous contrast. RADIATION DOSE REDUCTION: This exam was performed according to the departmental dose-optimization program which includes automated exposure control, adjustment of the mA and/or kV according to patient size and/or use of  iterative reconstruction technique. CONTRAST:  OMNIPAQUE IOHEXOL 300 MG/ML  SOLN COMPARISON:  05/28/2019 FINDINGS: Lower chest: Unremarkable. Hepatobiliary: 1.7 cm lesion in the lateral segment left liver (image 9/2) shows peripheral nodular enhancement inferiorly (11/2 and coronal 72/5) consistent with benign cavernous hemangioma. No followup imaging  is recommended. Gallbladder is distended with very subtle pericholecystic edema evident (image 30/2 and coronal 58/5). No calcified gallstones evident. No intrahepatic or extrahepatic biliary dilation. Pancreas: No focal mass lesion. No dilatation of the main duct. No intraparenchymal cyst. No peripancreatic edema. Spleen: No splenomegaly. No suspicious focal mass lesion. Adrenals/Urinary Tract: No adrenal nodule or mass. Kidneys unremarkable. No evidence for hydroureter. The urinary bladder appears normal for the degree of distention. Stomach/Bowel: Stomach is unremarkable. No gastric wall thickening. No evidence of outlet obstruction. Duodenum is normally positioned as is the ligament of Treitz. No small bowel wall thickening. No small bowel dilatation. The terminal ileum is normal. The appendix is normal. No gross colonic mass. No colonic wall thickening. Vascular/Lymphatic: No abdominal aortic aneurysm. No abdominal aortic atherosclerotic calcification. Portal vein superior mesenteric vein are patent. There is no gastrohepatic or hepatoduodenal ligament lymphadenopathy. No retroperitoneal or mesenteric lymphadenopathy. No pelvic sidewall lymphadenopathy. Reproductive: Unremarkable. Other: Trace free fluid seen in the cul-de-sac. Musculoskeletal: No worrisome lytic or sclerotic osseous abnormality. IMPRESSION: 1. Distended gallbladder with very subtle pericholecystic edema. No calcified gallstones evident. Right upper quadrant ultrasound could be used to further evaluate as clinically warranted. 2. Trace free fluid in the cul-de-sac, likely physiologic.  Electronically Signed   By: Kennith Center M.D.   On: 10/20/2022 10:54     Time coordinating discharge: Over 30 minutes    Lewie Chamber, MD  [facility] 10/22/2022, 12:08 PM

## 2022-10-22 NOTE — Plan of Care (Signed)

## 2022-10-22 NOTE — Progress Notes (Addendum)
[facility] Progress Note     Subjective: CC:  Reports feeling better today. Reports mild pain in her lower abdomen bilaterally. PTA she had epigastric pain that was constant, severe, unrelieved by Mylanta. She also reports projectile vomiting. She tells me that for years she has had abdominal discomfort worse with spicy foods, so she eats bland food with less seasoning. She denies emesis after meals. She states that she has had a lot of "inflammation" of her abdomen and irregular bowel habits/diarrhea.  Normal upper endoscopy by Dr. Myrtie Neither 2021, normal colonoscopy in 2018 by Dr. Myrtie Neither, diverticulosis of R colon noted.  Works at [employer]. Has a toddler.  Objective: Vital signs in last 24 hours: Temp:  [98.1 F (36.7 C)-98.4 F (36.9 C)] 98.1 F (36.7 C) (08/05 0507) Pulse Rate:  [63-88] 88 (08/05 0507) Resp:  [18] 18 (08/05 0507) BP: (103-115)/(66-71) 115/71 (08/05 0507) SpO2:  [98 %-100 %] 98 % (08/05 0507) Last BM Date : 10/20/22  Intake/Output from previous day: 08/04 0701 - 08/05 0700 In: 200 [P.O.:200] Out: -  Intake/Output this shift: No intake/output data recorded.  PE: Gen:  Alert, NAD, pleasant Card:  Regular rate and rhythm, pedal pulses 2+ BL Pulm:  Normal effort, clear to auscultation bilaterally Abd: Soft, non-tender, non-distended, no hernias Skin: warm and dry, no rashes  Psych: A&Ox3   Lab Results:  Recent Labs    10/21/22 0735 10/22/22 0459  WBC 3.8* 4.5  HGB 12.4 12.2  HCT 38.2 38.7  PLT 267 257   BMET Recent Labs    10/21/22 0735 10/22/22 0459  NA 137 136  K 3.9 4.0  CL 106 106  CO2 23 25  GLUCOSE 98 101*  BUN 7 7  CREATININE 0.96 0.85  CALCIUM 9.0 9.2   PT/INR Recent Labs    10/20/22 1202 10/21/22 0735  LABPROT 13.8 14.6  INR 1.0 1.1   CMP     Component Value Date/Time   NA 136 10/22/2022 0459   K 4.0 10/22/2022 0459   CL 106 10/22/2022 0459   CO2 25 10/22/2022 0459   GLUCOSE 101 (H) 10/22/2022 0459   BUN 7  10/22/2022 0459   CREATININE 0.85 10/22/2022 0459   CALCIUM 9.2 10/22/2022 0459   PROT 6.6 10/22/2022 0459   ALBUMIN 3.5 10/22/2022 0459   AST 219 (H) 10/22/2022 0459   ALT 838 (H) 10/22/2022 0459   ALKPHOS 77 10/22/2022 0459   BILITOT 1.7 (H) 10/22/2022 0459   GFRNONAA >60 10/22/2022 0459   GFRAA >60 10/02/2015 1319   Lipase     Component Value Date/Time   LIPASE 35 10/20/2022 0923       Studies/Results: MR ABDOMEN MRCP W WO CONTAST  Result Date: 10/21/2022 CLINICAL DATA:  Cholecystitis and choledocholithiasis EXAM: MRI ABDOMEN WITHOUT AND WITH CONTRAST (INCLUDING MRCP) TECHNIQUE: Multiplanar multisequence MR imaging of the abdomen was performed both before and after the administration of intravenous contrast. Heavily T2-weighted images of the biliary and pancreatic ducts were obtained, and three-dimensional MRCP images were rendered by post processing. CONTRAST:  6mL GADAVIST GADOBUTROL 1 MMOL/ML IV SOLN COMPARISON:  Right upper quadrant ultrasound, 10/20/2022, CT abdomen pelvis, 10/20/2022 FINDINGS: Lower chest: No acute abnormality. Hepatobiliary: No solid liver abnormality is seen. Small definitively benign hemangioma of the tip of the left lobe of the liver, requiring no further follow-up or characterization. Multiple tiny gallstones in the gallbladder. No choledocholithiasis appreciated on today's examination. No biliary ductal dilatation Pancreas: Unremarkable. No pancreatic ductal dilatation or surrounding  inflammatory changes. Spleen: Normal in size without significant abnormality. Adrenals/Urinary Tract: Adrenal glands are unremarkable. Kidneys are normal, without renal calculi, solid lesion, or hydronephrosis. Stomach/Bowel: Stomach is within normal limits. No evidence of bowel wall thickening, distention, or inflammatory changes. Vascular/Lymphatic: No significant vascular findings are present. No enlarged abdominal lymph nodes. Other: No abdominal wall hernia or abnormality. No  ascites. Musculoskeletal: No acute or significant osseous findings. IMPRESSION: Multiple tiny gallstones in the gallbladder. No choledocholithiasis appreciated on today's examination; a small calculus in the common bile duct identified by prior ultrasound may have passed in the interval. No biliary ductal dilatation. Electronically Signed   By: Jearld Lesch M.D.   On: 10/21/2022 10:25   MR 3D Recon At Scanner  Result Date: 10/21/2022 CLINICAL DATA:  Cholecystitis and choledocholithiasis EXAM: MRI ABDOMEN WITHOUT AND WITH CONTRAST (INCLUDING MRCP) TECHNIQUE: Multiplanar multisequence MR imaging of the abdomen was performed both before and after the administration of intravenous contrast. Heavily T2-weighted images of the biliary and pancreatic ducts were obtained, and three-dimensional MRCP images were rendered by post processing. CONTRAST:  6mL GADAVIST GADOBUTROL 1 MMOL/ML IV SOLN COMPARISON:  Right upper quadrant ultrasound, 10/20/2022, CT abdomen pelvis, 10/20/2022 FINDINGS: Lower chest: No acute abnormality. Hepatobiliary: No solid liver abnormality is seen. Small definitively benign hemangioma of the tip of the left lobe of the liver, requiring no further follow-up or characterization. Multiple tiny gallstones in the gallbladder. No choledocholithiasis appreciated on today's examination. No biliary ductal dilatation Pancreas: Unremarkable. No pancreatic ductal dilatation or surrounding inflammatory changes. Spleen: Normal in size without significant abnormality. Adrenals/Urinary Tract: Adrenal glands are unremarkable. Kidneys are normal, without renal calculi, solid lesion, or hydronephrosis. Stomach/Bowel: Stomach is within normal limits. No evidence of bowel wall thickening, distention, or inflammatory changes. Vascular/Lymphatic: No significant vascular findings are present. No enlarged abdominal lymph nodes. Other: No abdominal wall hernia or abnormality. No ascites. Musculoskeletal: No acute or  significant osseous findings. IMPRESSION: Multiple tiny gallstones in the gallbladder. No choledocholithiasis appreciated on today's examination; a small calculus in the common bile duct identified by prior ultrasound may have passed in the interval. No biliary ductal dilatation. Electronically Signed   By: Jearld Lesch M.D.   On: 10/21/2022 10:25   US Abdomen Limited RUQ (LIVER/GB)  Result Date: 10/20/2022 CLINICAL DATA:  Right upper quadrant pain for the past week with nausea, vomiting, and diarrhea. EXAM: ULTRASOUND ABDOMEN LIMITED RIGHT UPPER QUADRANT COMPARISON:  CT abdomen pelvis from same day. FINDINGS: Gallbladder: Multiple layering tiny gallstones with mild wall thickening and trace pericholecystic fluid. Positive sonographic Murphy sign noted by sonographer. Common bile duct: Diameter: 7 mm, at the upper limits of normal. 4 mm gallstone seen within the common bile duct near the pancreatic head. Liver: 1.6 x 1.2 x 1.1 cm echogenic lesion in the left lateral liver, characterized as a hemangioma on today's CT scan. Within normal limits in parenchymal echogenicity. Portal vein is patent on color Doppler imaging with normal direction of blood flow towards the liver. Other: None. IMPRESSION: 1. Findings concerning for acute cholecystitis. 2. Choledocholithiasis without significant biliary ductal dilatation. Electronically Signed   By: Obie Dredge M.D.   On: 10/20/2022 12:18   CT ABDOMEN PELVIS W CONTRAST  Result Date: 10/20/2022 CLINICAL DATA:  Abdominal pain with vomiting and diarrhea. EXAM: CT ABDOMEN AND PELVIS WITH CONTRAST TECHNIQUE: Multidetector CT imaging of the abdomen and pelvis was performed using the standard protocol following bolus administration of intravenous contrast. RADIATION DOSE REDUCTION: This exam was performed according to  the departmental dose-optimization program which includes automated exposure control, adjustment of the mA and/or kV according to patient size and/or use of  iterative reconstruction technique. CONTRAST:  OMNIPAQUE IOHEXOL 300 MG/ML  SOLN COMPARISON:  05/28/2019 FINDINGS: Lower chest: Unremarkable. Hepatobiliary: 1.7 cm lesion in the lateral segment left liver (image 9/2) shows peripheral nodular enhancement inferiorly (11/2 and coronal 72/5) consistent with benign cavernous hemangioma. No followup imaging is recommended. Gallbladder is distended with very subtle pericholecystic edema evident (image 30/2 and coronal 58/5). No calcified gallstones evident. No intrahepatic or extrahepatic biliary dilation. Pancreas: No focal mass lesion. No dilatation of the main duct. No intraparenchymal cyst. No peripancreatic edema. Spleen: No splenomegaly. No suspicious focal mass lesion. Adrenals/Urinary Tract: No adrenal nodule or mass. Kidneys unremarkable. No evidence for hydroureter. The urinary bladder appears normal for the degree of distention. Stomach/Bowel: Stomach is unremarkable. No gastric wall thickening. No evidence of outlet obstruction. Duodenum is normally positioned as is the ligament of Treitz. No small bowel wall thickening. No small bowel dilatation. The terminal ileum is normal. The appendix is normal. No gross colonic mass. No colonic wall thickening. Vascular/Lymphatic: No abdominal aortic aneurysm. No abdominal aortic atherosclerotic calcification. Portal vein superior mesenteric vein are patent. There is no gastrohepatic or hepatoduodenal ligament lymphadenopathy. No retroperitoneal or mesenteric lymphadenopathy. No pelvic sidewall lymphadenopathy. Reproductive: Unremarkable. Other: Trace free fluid seen in the cul-de-sac. Musculoskeletal: No worrisome lytic or sclerotic osseous abnormality. IMPRESSION: 1. Distended gallbladder with very subtle pericholecystic edema. No calcified gallstones evident. Right upper quadrant ultrasound could be used to further evaluate as clinically warranted. 2. Trace free fluid in the cul-de-sac, likely physiologic.  Electronically Signed   By: Kennith Center M.D.   On: 10/20/2022 10:54    Anti-infectives: Anti-infectives (From admission, onward)    Start     Dose/Rate Route Frequency Ordered Stop   10/20/22 2200  piperacillin-tazobactam (ZOSYN) IVPB 3.375 g       Placed in "Followed by" Linked Group   3.375 g 12.5 mL/hr over 240 Minutes Intravenous Every 8 hours 10/20/22 1237     10/20/22 1245  piperacillin-tazobactam (ZOSYN) IVPB 3.375 g       Placed in "Followed by" Linked Group   3.375 g 100 mL/hr over 30 Minutes Intravenous  Once 10/20/22 1237 10/20/22 1348        Assessment/Plan Possible choledocholithiasis, now passed. Her history is consistent with years of biliary colic. Low suspicion for cholecystitis - MRI without cholecystitis, no fever, no leukocytosis, no RUQ pain today. She has a indirect hyperbilirubinemia concerning for primary liver abnormality. Appreciate GI Ruling out any other etiologies of transaminitis. Recommend outpatient follow up in our office in a few weeks to discuss elective cholecystectomy for symptomatic cholelithiasis. No role for abx, I do not think she has cholecystitis.    LOS: 2 days   I reviewed nursing notes, Consultant GI notes, hospitalist notes, last 24 h vitals and pain scores, last 48 h intake and output, last 24 h labs and trends, and last 24 h imaging results.  This care required moderate level of medical decision making.   Hosie Spangle, PA-C [facility] Please see Amion for pager number during day hours 7:00am-4:30pm

## 2022-10-22 NOTE — Progress Notes (Signed)
   10/22/22 0855  TOC Brief Assessment  Insurance and Status Reviewed  Patient has primary care physician Yes  Home environment has been reviewed Home  Prior level of function: Independent  Prior/Current Home Services No current home services  Social Determinants of Health Reivew SDOH reviewed no interventions necessary  Readmission risk has been reviewed Yes  Transition of care needs no transition of care needs at this time

## 2022-10-22 NOTE — Plan of Care (Signed)

## 2022-10-22 NOTE — Progress Notes (Signed)
Progress Note   Subjective  Chief Complaint: Elevated LFTs  Today, patient feeling better, in fact she is getting up and going to the bathroom when I come in the room, describes a bowel movement yesterday, feels some continued pain in her lower abdomen actually, but tolerating her current diet of soft foods.  No nausea, no vomiting.  Tells me the surgical team told her she could possibly get her gallbladder out as an outpatient.   Objective   Vital signs in last 24 hours: Temp:  [98.1 F (36.7 C)-98.4 F (36.9 C)] 98.1 F (36.7 C) (08/05 0507) Pulse Rate:  [63-88] 88 (08/05 0507) Resp:  [18] 18 (08/05 0507) BP: (103-115)/(66-71) 115/71 (08/05 0507) SpO2:  [98 %-100 %] 98 % (08/05 0507) Last BM Date : 10/20/22 General:  AA female in NAD Heart:  Regular rate and rhythm; no murmurs Lungs: Respirations even and unlabored, lungs CTA bilaterally Abdomen:  Soft, nontender and nondistended. Normal bowel sounds. Psych:  Cooperative. Normal mood and affect.  Intake/Output from previous day: 08/04 0701 - 08/05 0700 In: 200 [P.O.:200] Out: -   Lab Results: Recent Labs    10/20/22 0923 10/21/22 0735 10/22/22 0459  WBC 5.9 3.8* 4.5  HGB 12.9 12.4 12.2  HCT 38.7 38.2 38.7  PLT 278 267 257   BMET Recent Labs    10/20/22 0923 10/21/22 0735 10/22/22 0459  NA 134* 137 136  K 3.8 3.9 4.0  CL 104 106 106  CO2 23 23 25   GLUCOSE 108* 98 101*  BUN 11 7 7   CREATININE 0.76 0.96 0.85  CALCIUM 9.8 9.0 9.2   LFT Recent Labs    10/22/22 0459  PROT 6.6  ALBUMIN 3.5  AST 219*  ALT 838*  ALKPHOS 77  BILITOT 1.7*  BILIDIR 0.3*  IBILI 1.4*   PT/INR Recent Labs    10/20/22 1202 10/21/22 0735  LABPROT 13.8 14.6  INR 1.0 1.1    Studies/Results: MR ABDOMEN MRCP W WO CONTAST  Result Date: 10/21/2022 CLINICAL DATA:  Cholecystitis and choledocholithiasis EXAM: MRI ABDOMEN WITHOUT AND WITH CONTRAST (INCLUDING MRCP) TECHNIQUE: Multiplanar multisequence MR imaging of the  abdomen was performed both before and after the administration of intravenous contrast. Heavily T2-weighted images of the biliary and pancreatic ducts were obtained, and three-dimensional MRCP images were rendered by post processing. CONTRAST:  6mL GADAVIST GADOBUTROL 1 MMOL/ML IV SOLN COMPARISON:  Right upper quadrant ultrasound, 10/20/2022, CT abdomen pelvis, 10/20/2022 FINDINGS: Lower chest: No acute abnormality. Hepatobiliary: No solid liver abnormality is seen. Small definitively benign hemangioma of the tip of the left lobe of the liver, requiring no further follow-up or characterization. Multiple tiny gallstones in the gallbladder. No choledocholithiasis appreciated on today's examination. No biliary ductal dilatation Pancreas: Unremarkable. No pancreatic ductal dilatation or surrounding inflammatory changes. Spleen: Normal in size without significant abnormality. Adrenals/Urinary Tract: Adrenal glands are unremarkable. Kidneys are normal, without renal calculi, solid lesion, or hydronephrosis. Stomach/Bowel: Stomach is within normal limits. No evidence of bowel wall thickening, distention, or inflammatory changes. Vascular/Lymphatic: No significant vascular findings are present. No enlarged abdominal lymph nodes. Other: No abdominal wall hernia or abnormality. No ascites. Musculoskeletal: No acute or significant osseous findings. IMPRESSION: Multiple tiny gallstones in the gallbladder. No choledocholithiasis appreciated on today's examination; a small calculus in the common bile duct identified by prior ultrasound may have passed in the interval. No biliary ductal dilatation. Electronically Signed   By: Jearld Lesch M.D.   On: 10/21/2022 10:25   MR  3D Recon At Scanner  Result Date: 10/21/2022 CLINICAL DATA:  Cholecystitis and choledocholithiasis EXAM: MRI ABDOMEN WITHOUT AND WITH CONTRAST (INCLUDING MRCP) TECHNIQUE: Multiplanar multisequence MR imaging of the abdomen was performed both before and after  the administration of intravenous contrast. Heavily T2-weighted images of the biliary and pancreatic ducts were obtained, and three-dimensional MRCP images were rendered by post processing. CONTRAST:  6mL GADAVIST GADOBUTROL 1 MMOL/ML IV SOLN COMPARISON:  Right upper quadrant ultrasound, 10/20/2022, CT abdomen pelvis, 10/20/2022 FINDINGS: Lower chest: No acute abnormality. Hepatobiliary: No solid liver abnormality is seen. Small definitively benign hemangioma of the tip of the left lobe of the liver, requiring no further follow-up or characterization. Multiple tiny gallstones in the gallbladder. No choledocholithiasis appreciated on today's examination. No biliary ductal dilatation Pancreas: Unremarkable. No pancreatic ductal dilatation or surrounding inflammatory changes. Spleen: Normal in size without significant abnormality. Adrenals/Urinary Tract: Adrenal glands are unremarkable. Kidneys are normal, without renal calculi, solid lesion, or hydronephrosis. Stomach/Bowel: Stomach is within normal limits. No evidence of bowel wall thickening, distention, or inflammatory changes. Vascular/Lymphatic: No significant vascular findings are present. No enlarged abdominal lymph nodes. Other: No abdominal wall hernia or abnormality. No ascites. Musculoskeletal: No acute or significant osseous findings. IMPRESSION: Multiple tiny gallstones in the gallbladder. No choledocholithiasis appreciated on today's examination; a small calculus in the common bile duct identified by prior ultrasound may have passed in the interval. No biliary ductal dilatation. Electronically Signed   By: Jearld Lesch M.D.   On: 10/21/2022 10:25   US Abdomen Limited RUQ (LIVER/GB)  Result Date: 10/20/2022 CLINICAL DATA:  Right upper quadrant pain for the past week with nausea, vomiting, and diarrhea. EXAM: ULTRASOUND ABDOMEN LIMITED RIGHT UPPER QUADRANT COMPARISON:  CT abdomen pelvis from same day. FINDINGS: Gallbladder: Multiple layering tiny  gallstones with mild wall thickening and trace pericholecystic fluid. Positive sonographic Murphy sign noted by sonographer. Common bile duct: Diameter: 7 mm, at the upper limits of normal. 4 mm gallstone seen within the common bile duct near the pancreatic head. Liver: 1.6 x 1.2 x 1.1 cm echogenic lesion in the left lateral liver, characterized as a hemangioma on today's CT scan. Within normal limits in parenchymal echogenicity. Portal vein is patent on color Doppler imaging with normal direction of blood flow towards the liver. Other: None. IMPRESSION: 1. Findings concerning for acute cholecystitis. 2. Choledocholithiasis without significant biliary ductal dilatation. Electronically Signed   By: Obie Dredge M.D.   On: 10/20/2022 12:18   CT ABDOMEN PELVIS W CONTRAST  Result Date: 10/20/2022 CLINICAL DATA:  Abdominal pain with vomiting and diarrhea. EXAM: CT ABDOMEN AND PELVIS WITH CONTRAST TECHNIQUE: Multidetector CT imaging of the abdomen and pelvis was performed using the standard protocol following bolus administration of intravenous contrast. RADIATION DOSE REDUCTION: This exam was performed according to the departmental dose-optimization program which includes automated exposure control, adjustment of the mA and/or kV according to patient size and/or use of iterative reconstruction technique. CONTRAST:  OMNIPAQUE IOHEXOL 300 MG/ML  SOLN COMPARISON:  05/28/2019 FINDINGS: Lower chest: Unremarkable. Hepatobiliary: 1.7 cm lesion in the lateral segment left liver (image 9/2) shows peripheral nodular enhancement inferiorly (11/2 and coronal 72/5) consistent with benign cavernous hemangioma. No followup imaging is recommended. Gallbladder is distended with very subtle pericholecystic edema evident (image 30/2 and coronal 58/5). No calcified gallstones evident. No intrahepatic or extrahepatic biliary dilation. Pancreas: No focal mass lesion. No dilatation of the main duct. No intraparenchymal cyst. No  peripancreatic edema. Spleen: No splenomegaly. No suspicious focal mass  lesion. Adrenals/Urinary Tract: No adrenal nodule or mass. Kidneys unremarkable. No evidence for hydroureter. The urinary bladder appears normal for the degree of distention. Stomach/Bowel: Stomach is unremarkable. No gastric wall thickening. No evidence of outlet obstruction. Duodenum is normally positioned as is the ligament of Treitz. No small bowel wall thickening. No small bowel dilatation. The terminal ileum is normal. The appendix is normal. No gross colonic mass. No colonic wall thickening. Vascular/Lymphatic: No abdominal aortic aneurysm. No abdominal aortic atherosclerotic calcification. Portal vein superior mesenteric vein are patent. There is no gastrohepatic or hepatoduodenal ligament lymphadenopathy. No retroperitoneal or mesenteric lymphadenopathy. No pelvic sidewall lymphadenopathy. Reproductive: Unremarkable. Other: Trace free fluid seen in the cul-de-sac. Musculoskeletal: No worrisome lytic or sclerotic osseous abnormality. IMPRESSION: 1. Distended gallbladder with very subtle pericholecystic edema. No calcified gallstones evident. Right upper quadrant ultrasound could be used to further evaluate as clinically warranted. 2. Trace free fluid in the cul-de-sac, likely physiologic. Electronically Signed   By: Kennith Center M.D.   On: 10/20/2022 10:54      Assessment / Plan:   Assessment: 1.  Pancreatitis: Admitted with nausea, vomiting, diarrhea and abdominal pain, labs with elevated LFTs AST 1278, ALT 948, normal alk phos, total bili 2.1, CT with subtle pericholecystic edema, ultrasound findings concerning for acute cholecystitis and choledocholithiasis, 4 mm gallstone seen within the CBD near the pancreatic head, acute viral hepatitis panel negative, MRCP showed multiple tiny gallstones in the gallbladder but no choledocholithiasis, LFTs trending down and pain better; question relation to cholecystitis +/- past bile duct  stone as well as COVID infection 2.  COVID-positive  Plan: 1.  Most likely gallstone pancreatitis, no further bile duct stone on MRCP and LFTs trending down, patient now mostly asymptomatic and tolerating a soft diet. 2.  Would benefit from a cholecystectomy in future, will leave plans for this up to the surgical team 3.  Patient does not need to follow-up in the outpatient GI office  Thank you for your kind consultation, we will sign off.   LOS: 2 days   Unk Lightning  10/22/2022, 9:39 AM

## 2022-10-23 LAB — VARICELLA ZOSTER ANTIBODY, IGM: Varicella-Zoster Ab, IgM: <0.91 {index} (ref 0.00–0.90)

## 2022-10-30 ENCOUNTER — Emergency Department (HOSPITAL_BASED_OUTPATIENT_CLINIC_OR_DEPARTMENT_OTHER)
Admission: EM | Admit: 2022-10-30 | Discharge: 2022-10-31 | Disposition: A | Discharge status: Home / Self Care | Payer: 59 | Attending: Emergency Medicine | Admitting: Emergency Medicine

## 2022-10-30 ENCOUNTER — Encounter (HOSPITAL_BASED_OUTPATIENT_CLINIC_OR_DEPARTMENT_OTHER): Payer: Self-pay | Admitting: Emergency Medicine

## 2022-10-30 ENCOUNTER — Emergency Department (HOSPITAL_BASED_OUTPATIENT_CLINIC_OR_DEPARTMENT_OTHER): Payer: 59

## 2022-10-30 ENCOUNTER — Other Ambulatory Visit: Payer: Self-pay

## 2022-10-30 DIAGNOSIS — R1013 Epigastric pain: Secondary | ICD-10-CM | POA: Diagnosis not present

## 2022-10-30 DIAGNOSIS — R1031 Right lower quadrant pain: Secondary | ICD-10-CM | POA: Insufficient documentation

## 2022-10-30 DIAGNOSIS — R1032 Left lower quadrant pain: Secondary | ICD-10-CM | POA: Diagnosis not present

## 2022-10-30 LAB — COMPREHENSIVE METABOLIC PANEL
ALT: 301 U/L — ABNORMAL HIGH (ref 0–44)
AST: 427 U/L — ABNORMAL HIGH (ref 15–41)
Albumin: 4.3 g/dL (ref 3.5–5.0)
Alkaline Phosphatase: 71 U/L (ref 38–126)
Anion gap: 9 (ref 5–15)
BUN: 9 mg/dL (ref 6–20)
CO2: 23 mmol/L (ref 22–32)
Calcium: 9.5 mg/dL (ref 8.9–10.3)
Chloride: 104 mmol/L (ref 98–111)
Creatinine, Ser: 0.79 mg/dL (ref 0.44–1.00)
GFR, Estimated: >60 mL/min (ref >60)
Glucose, Bld: 123 mg/dL — ABNORMAL HIGH (ref 70–99)
Potassium: 3.6 mmol/L (ref 3.5–5.1)
Sodium: 136 mmol/L (ref 135–145)
Total Bilirubin: 1.4 mg/dL — ABNORMAL HIGH (ref 0.3–1.2)
Total Protein: 7.4 g/dL (ref 6.5–8.1)

## 2022-10-30 LAB — CBC WITH DIFFERENTIAL/PLATELET
Abs Immature Granulocytes: 0.02 10*3/uL (ref 0.00–0.07)
Basophils Absolute: 0 10*3/uL (ref 0.0–0.1)
Basophils Relative: 1 %
Eosinophils Absolute: 0.1 10*3/uL (ref 0.0–0.5)
Eosinophils Relative: 1 %
HCT: 38.5 % (ref 36.0–46.0)
Hemoglobin: 12.7 g/dL (ref 12.0–15.0)
Immature Granulocytes: 0 %
Lymphocytes Relative: 19 %
Lymphs Abs: 1.2 10*3/uL (ref 0.7–4.0)
MCH: 30.2 pg (ref 26.0–34.0)
MCHC: 33 g/dL (ref 30.0–36.0)
MCV: 91.7 fL (ref 80.0–100.0)
Monocytes Absolute: 0.4 10*3/uL (ref 0.1–1.0)
Monocytes Relative: 7 %
Neutro Abs: 4.6 10*3/uL (ref 1.7–7.7)
Neutrophils Relative %: 72 %
Platelets: 267 10*3/uL (ref 150–400)
RBC: 4.2 MIL/uL (ref 3.87–5.11)
RDW: 12.6 % (ref 11.5–15.5)
WBC: 6.3 10*3/uL (ref 4.0–10.5)
nRBC: 0 % (ref 0.0–0.2)

## 2022-10-30 LAB — LIPASE, BLOOD: Lipase: 28 U/L (ref 11–51)

## 2022-10-30 NOTE — ED Triage Notes (Signed)
Pt returning for ABD pain that she had last week. They think it is gallbladder. States started to do surgery the next morning. Then scheduled consultation with surgeon for 27th. Pain is back, diarrhea, nausea.

## 2022-10-31 MED ORDER — DICYCLOMINE HCL 20 MG PO TABS: 20.0000 mg | ORAL_TABLET | Freq: Two times a day (BID) | ORAL | 0 refills | Status: AC

## 2022-10-31 MED ORDER — FAMOTIDINE 20 MG PO TABS: 20.0000 mg | ORAL_TABLET | Freq: Two times a day (BID) | ORAL | 0 refills | Status: AC

## 2022-10-31 MED ORDER — LIDOCAINE VISCOUS HCL 2 % MT SOLN
15.0000 mL | Freq: Once | OROMUCOSAL | Status: AC
  Administered 2022-10-31: 15 mL via ORAL
  Filled 2022-10-31: qty 15

## 2022-10-31 MED ORDER — ONDANSETRON HCL 4 MG PO TABS: 4.0000 mg | ORAL_TABLET | Freq: Three times a day (TID) | ORAL | 0 refills | Status: AC | PRN

## 2022-10-31 MED ORDER — OMEPRAZOLE 20 MG PO CPDR: 20.0000 mg | DELAYED_RELEASE_CAPSULE | Freq: Every day | ORAL | 0 refills | Status: AC

## 2022-10-31 MED ORDER — PANTOPRAZOLE SODIUM 40 MG PO TBEC
40.0000 mg | DELAYED_RELEASE_TABLET | Freq: Once | ORAL | Status: AC
  Administered 2022-10-31: 40 mg via ORAL
  Filled 2022-10-31: qty 1

## 2022-10-31 MED ORDER — OXYCODONE-ACETAMINOPHEN 5-325 MG PO TABS
1.0000 | ORAL_TABLET | Freq: Three times a day (TID) | ORAL | 0 refills | Status: DC | PRN
Start: 2022-10-31 — End: 2022-12-21

## 2022-10-31 MED ORDER — ALUM & MAG HYDROXIDE-SIMETH 200-200-20 MG/5ML PO SUSP
30.0000 mL | Freq: Once | ORAL | Status: AC
  Administered 2022-10-31: 30 mL via ORAL
  Filled 2022-10-31: qty 30

## 2022-10-31 NOTE — ED Provider Notes (Signed)
[place] [facility] Provider Note   CSN: [id number] Arrival date & time: 10/30/22  2137     History  Chief Complaint  Patient presents with   Abdominal Pain    Beverly Stewart is a 37 y.o. female.  Patient with known cholelithiasis previous episode of choledocholithiasis that self resolved and has a surgery consultation on August 27.  She presents to the ER today secondary to soft symptoms that she had the other day with abdominal discomfort and a lot of vomiting.  She states that the last time she vomited that the symptoms have all resolved at this point.  No fevers.  Otherwise has been feeling well recently.  She also states that she has some intermittent episodes of bilateral lower abdominal pain that has not really been worked up.    Abdominal Pain      Home Medications Prior to Admission medications   Medication Sig Start Date End Date Taking? Authorizing Provider  dicyclomine (BENTYL) 20 MG tablet Take 1 tablet (20 mg total) by mouth 2 (two) times daily. 10/31/22  Yes Avaeh Ewer, Barbara Cower, MD  famotidine (PEPCID) 20 MG tablet Take 1 tablet (20 mg total) by mouth 2 (two) times daily. 10/31/22  Yes Calle Schader, Barbara Cower, MD  omeprazole (PRILOSEC) 20 MG capsule Take 1 capsule (20 mg total) by mouth daily. 10/31/22  Yes Chiyo Fay, Barbara Cower, MD  ondansetron (ZOFRAN) 4 MG tablet Take 1 tablet (4 mg total) by mouth every 8 (eight) hours as needed for nausea or vomiting. 10/31/22  Yes Dyllon Henken, Barbara Cower, MD  oxyCODONE-acetaminophen (PERCOCET) 5-325 MG tablet Take 1 tablet by mouth every 8 (eight) hours as needed for severe pain. 10/31/22  Yes Danyell Awbrey, Barbara Cower, MD  acetaminophen (TYLENOL) 325 MG tablet Take 2 tablets (650 mg total) by mouth every 6 (six) hours as needed (for pain scale < 4). Patient taking differently: Take 650 mg by mouth as needed for moderate pain. 05/02/21   Charlett Nose, MD  fluticasone (FLONASE) 50 MCG/ACT nasal spray Place 1 spray into both nostrils  daily. Begin by using 2 sprays in each nare daily for 3 to 5 days, then decrease to 1 spray in each nare daily. Patient taking differently: Place 1 spray into both nostrils as needed for allergies. 09/17/21   Theadora Rama Scales, PA-C  Naphazoline HCl (CLEAR EYES OP) Place 1-2 drops into both eyes as needed (dry eye).    [provider]      Allergies    Patient has no known allergies.    Review of Systems   Review of Systems  Gastrointestinal:  Positive for abdominal pain.    Physical Exam Updated Vital Signs BP 129/70 (BP Location: Right Arm)   Pulse 83   Temp 98 F (36.7 C) (Oral)   Resp 20   Wt 59 kg   LMP 09/26/2022 (Approximate) Comment: neg preg test  SpO2 99%   BMI 20.98 kg/m  Physical Exam Vitals and nursing note reviewed.  Constitutional:      Appearance: She is well-developed.  HENT:     Head: Normocephalic and atraumatic.  Cardiovascular:     Rate and Rhythm: Normal rate and regular rhythm.  Pulmonary:     Effort: No respiratory distress.     Breath sounds: No stridor.  Abdominal:     General: There is no distension.     Tenderness: There is no abdominal tenderness.  Musculoskeletal:     Cervical back: Normal range of motion.  Neurological:  Mental Status: She is alert.     ED Results / Procedures / Treatments   Labs (all labs ordered are listed, but only abnormal results are displayed) Labs Reviewed  COMPREHENSIVE METABOLIC PANEL - Abnormal; Notable for the following components:      Result Value   Glucose, Bld 123 (*)    AST 427 (*)    ALT 301 (*)    Total Bilirubin 1.4 (*)    All other components within normal limits  C DIFFICILE QUICK SCREEN W PCR REFLEX    LIPASE, BLOOD  CBC WITH DIFFERENTIAL/PLATELET    EKG None  Radiology US Abdomen Limited RUQ (LIVER/GB)  Result Date: 10/31/2022 CLINICAL DATA:  [id number] Abdominal pain [id number] EXAM: ULTRASOUND ABDOMEN LIMITED RIGHT UPPER QUADRANT COMPARISON:  MRI abdomen 10/21/2022,  ultrasound abdomen 10/20/2022 FINDINGS: Gallbladder: Calcified gallstones within the gallbladder lumen. Mild gallbladder wall thickening visualized. No pericholecystic fluid no sonographic Murphy sign noted by sonographer. Common bile duct: Diameter: 4 mm Liver: No focal lesion identified. Within normal limits in parenchymal echogenicity. Portal vein is patent on color Doppler imaging with normal direction of blood flow towards the liver. Other: None. IMPRESSION: Cholelithiasis with nonspecific gallbladder wall thickening. No pericholecystic fluid. Negative sonographic Murphy sign. Findings equivocal. Correlate clinically for acute cholecystitis. Electronically Signed   By: Tish Frederickson M.D.   On: 10/31/2022 00:43    Procedures Procedures    Medications Ordered in ED Medications  pantoprazole (PROTONIX) EC tablet 40 mg (40 mg Oral Given 10/31/22 0128)  alum & mag hydroxide-simeth (MAALOX/MYLANTA) 200-200-20 MG/5ML suspension 30 mL (30 mLs Oral Given 10/31/22 0129)    And  lidocaine (XYLOCAINE) 2 % viscous mouth solution 15 mL (15 mLs Oral Given 10/31/22 0129)    ED Course/ Medical Decision Making/ A&P                                 Medical Decision Making Amount and/or Complexity of Data Reviewed Labs: ordered. Radiology: ordered.  Risk OTC drugs. Prescription drug management.   Patient has been in the ER and persistently pain-free for approximately 4 hours for emesis.  I suspect her symptoms today are more related to delayed gastric emptying versus peptic ulcer or general gastritis.  Will treat for same.  Ultrasound was repeated secondary to her recent history however this only shows a thickened gallbladder wall and cholelithiasis but no evidence of choledocholithiasis or stuck stone.  Negative Murphy sign.  Liver enzymes are mildly elevated but seem to be improving from previously.  She is tolerating p.o.  Will treat her for both gastritis and abdominal cramping.  Will likely need GI  follow-up once her biliary situation is resolved.  I discussed with her appropriate diet for biliary colic and signs symptoms to return to the emergency room versus treatment home.  Give a short course of pain medication in case a biliary colic comes back so that she can try to avoid, back prior to her surgery but she knows it does not get better or have a persistent emesis she needs to return.   Final Clinical Impression(s) / ED Diagnoses Final diagnoses:  Epigastric pain    Rx / DC Orders ED Discharge Orders          Ordered    oxyCODONE-acetaminophen (PERCOCET) 5-325 MG tablet  Every 8 hours PRN        10/31/22 0204    omeprazole (PRILOSEC) 20 MG capsule  Daily        10/31/22 0204    famotidine (PEPCID) 20 MG tablet  2 times daily        10/31/22 0204    dicyclomine (BENTYL) 20 MG tablet  2 times daily        10/31/22 0204    ondansetron (ZOFRAN) 4 MG tablet  Every 8 hours PRN        10/31/22 0209              Jobe Mutch, Barbara Cower, MD 10/31/22 [phone]

## 2022-11-13 ENCOUNTER — Other Ambulatory Visit: Payer: Self-pay | Admitting: Surgery

## 2022-11-27 NOTE — Patient Instructions (Signed)
SURGICAL WAITING ROOM VISITATION  Patients having surgery or a procedure may have no more than 2 support people in the waiting area - these visitors may rotate.    Children under the age of 75 must have an adult with them who is not the patient.  Due to an increase in RSV and influenza rates and associated hospitalizations, children ages 19 and under may not visit patients in [facility] hospitals.  If the patient needs to stay at the hospital during part of their recovery, the visitor guidelines for inpatient rooms apply. Pre-op nurse will coordinate an appropriate time for 1 support person to accompany patient in pre-op.  This support person may not rotate.    Please refer to the [facility] website for the visitor guidelines for Inpatients (after your surgery is over and you are in a regular room).       Your procedure is scheduled on: 12-19-22   Report to [facility] Main Entrance    Report to admitting at      0615 AM   Call this number if you have problems the morning of surgery [phone]   Do not eat food :After Midnight.   After Midnight you may have the following liquids until    0430______ AM DAY OF SURGERY  then nothing by mouth  Water Non-Citrus Juices (without pulp, NO RED-Apple, White grape, White cranberry) Black Coffee (NO MILK/CREAM OR CREAMERS, sugar ok)  Clear Tea (NO MILK/CREAM OR CREAMERS, sugar ok) regular and decaf                             Plain Jell-O (NO RED)                                           Fruit ices (not with fruit pulp, NO RED)                                     Popsicles (NO RED)                                                               Sports drinks like Gatorade (NO RED)                    The day of surgery:  Drink ONE (1) Pre-Surgery Clear Ensure  at   0415  AM the morning of surgery. Drink in one sitting. Do not sip.  This drink was given to you during your hospital  pre-op appointment visit. Nothing else to  drink after completing the  Pre-Surgery Clear Ensure by 0430 am.          If you have questions, please contact your surgeon's office.   FOLLOW ANY ADDITIONAL PRE OP INSTRUCTIONS YOU RECEIVED FROM YOUR SURGEON'S OFFICE!!!     Oral Hygiene is also important to reduce your risk of infection.  Remember - BRUSH YOUR TEETH THE MORNING OF SURGERY WITH YOUR REGULAR TOOTHPASTE  DENTURES WILL BE REMOVED PRIOR TO SURGERY PLEASE DO NOT APPLY "Poly grip" OR ADHESIVES!!!   Do NOT smoke after Midnight   Stop all vitamins and herbal supplements 7 days before surgery.   Take these medicines the morning of surgery with A SIP OF WATER: omeprazole, flonase, dicyclomine                                 You may not have any metal on your body including hair pins, jewelry, and body piercing             Do not wear make-up, lotions, powders, perfumes/cologne, or deodorant  Do not wear nail polish including gel and S&S, artificial/acrylic nails, or any other type of covering on natural nails including finger and toenails. If you have artificial nails, gel coating, etc. that needs to be removed by a nail salon please have this removed prior to surgery or surgery may need to be canceled/ delayed if the surgeon/ anesthesia feels like they are unable to be safely monitored.   Do not shave  48 hours prior to surgery.                Do not bring valuables to the hospital. [facility] IS NOT             RESPONSIBLE   FOR VALUABLES.   Contacts, glasses, dentures or bridgework may not be worn into surgery.   Bring small overnight bag day of surgery.   DO NOT BRING YOUR HOME MEDICATIONS TO THE HOSPITAL. PHARMACY WILL DISPENSE MEDICATIONS LISTED ON YOUR MEDICATION LIST TO YOU DURING YOUR ADMISSION IN THE HOSPITAL!    Patients discharged on the day of surgery will not be allowed to drive home.  Someone NEEDS to stay with you for the first 24 hours after anesthesia.   Special  Instructions: Bring a copy of your healthcare power of attorney and living will documents the day of surgery if you haven't scanned them before.              Please read over the following fact sheets you were given: IF YOU HAVE QUESTIONS ABOUT YOUR PRE-OP INSTRUCTIONS PLEASE CALL [phone]   . If you test positive for Covid or have been in contact with anyone that has tested positive in the last 10 days please notify you surgeon.    [facility] - Preparing for Surgery Before surgery, you can play an important role.  Because skin is not sterile, your skin needs to be as free of germs as possible.  You can reduce the number of germs on your skin by washing with CHG (chlorahexidine gluconate) soap before surgery.  CHG is an antiseptic cleaner which kills germs and bonds with the skin to continue killing germs even after washing. Please DO NOT use if you have an allergy to CHG or antibacterial soaps.  If your skin becomes reddened/irritated stop using the CHG and inform your nurse when you arrive at Short Stay. Do not shave (including legs and underarms) for at least 48 hours prior to the first CHG shower.  You may shave your face/neck. Please follow these instructions carefully:  1.  Shower with CHG Soap the night before surgery and the  morning of Surgery.  2.  If you choose to wash your hair, wash your hair first as  usual with your  normal  shampoo.  3.  After you shampoo, rinse your hair and body thoroughly to remove the  shampoo.                           4.  Use CHG as you would any other liquid soap.  You can apply chg directly  to the skin and wash                       Gently with a scrungie or clean washcloth.  5.  Apply the CHG Soap to your body ONLY FROM THE NECK DOWN.   Do not use on face/ open                           Wound or open sores. Avoid contact with eyes, ears mouth and genitals (private parts).                       Wash face,  Genitals (private parts) with your normal  soap.             6.  Wash thoroughly, paying special attention to the area where your surgery  will be performed.  7.  Thoroughly rinse your body with warm water from the neck down.  8.  DO NOT shower/wash with your normal soap after using and rinsing off  the CHG Soap.                9.  Pat yourself dry with a clean towel.            10.  Wear clean pajamas.            11.  Place clean sheets on your bed the night of your first shower and do not  sleep with pets. Day of Surgery : Do not apply any lotions/deodorants the morning of surgery.  Please wear clean clothes to the hospital/surgery center.  FAILURE TO FOLLOW THESE INSTRUCTIONS MAY RESULT IN THE CANCELLATION OF YOUR SURGERY PATIENT SIGNATURE_________________________________  NURSE SIGNATURE__________________________________  ________________________________________________________________________

## 2022-11-27 NOTE — Progress Notes (Addendum)
PCP - Duong Haydel Stanley   PA-C lov 11-13-22 epic Cardiologist - no  PPM/ICD -  Device Orders -  Rep Notified -   Chest x-ray -  EKG -  Stress Test -  ECHO -  Cardiac Cath -   Sleep Study -  CPAP -   Fasting Blood Sugar -  Checks Blood Sugar _____ times a day  Blood Thinner Instructions: Aspirin Instructions:  ERAS Protcol - PRE-SURGERY- ensure   COVID vaccine -yes  Activity--Able to climb a flight of stairs without SOB or CP Anesthesia review:   Patient denies shortness of breath, fever, cough and chest pain at PAT appointment   All instructions explained to the patient, with a verbal understanding of the material. Patient agrees to go over the instructions while at home for a better understanding. Patient also instructed to self quarantine after being tested for COVID-19. The opportunity to ask questions was provided.

## 2022-11-28 ENCOUNTER — Encounter (HOSPITAL_COMMUNITY)
Admission: RE | Admit: 2022-11-28 | Discharge: 2022-11-28 | Disposition: A | Discharge status: Home / Self Care | Payer: 59 | Source: Ambulatory Visit | Attending: Surgery

## 2022-11-28 ENCOUNTER — Encounter (HOSPITAL_COMMUNITY): Payer: Self-pay

## 2022-11-28 ENCOUNTER — Other Ambulatory Visit: Payer: Self-pay

## 2022-11-28 VITALS — BP 131/81 | HR 80 | Temp 97.7°F | Resp 16 | Ht 66.0 in | Wt 124.2 lb

## 2022-11-28 DIAGNOSIS — Z01812 Encounter for preprocedural laboratory examination: Secondary | ICD-10-CM | POA: Diagnosis not present

## 2022-11-28 DIAGNOSIS — Z01818 Encounter for other preprocedural examination: Secondary | ICD-10-CM | POA: Diagnosis present

## 2022-11-28 LAB — CBC
HCT: 42.1 % (ref 36.0–46.0)
Hemoglobin: 13.6 g/dL (ref 12.0–15.0)
MCH: 30.2 pg (ref 26.0–34.0)
MCHC: 32.3 g/dL (ref 30.0–36.0)
MCV: 93.6 fL (ref 80.0–100.0)
Platelets: 259 10*3/uL (ref 150–400)
RBC: 4.5 MIL/uL (ref 3.87–5.11)
RDW: 11.9 % (ref 11.5–15.5)
WBC: 5.8 10*3/uL (ref 4.0–10.5)
nRBC: 0 % (ref 0.0–0.2)

## 2022-12-17 ENCOUNTER — Other Ambulatory Visit: Payer: Self-pay

## 2022-12-17 ENCOUNTER — Encounter (HOSPITAL_BASED_OUTPATIENT_CLINIC_OR_DEPARTMENT_OTHER): Payer: Self-pay | Admitting: Emergency Medicine

## 2022-12-17 ENCOUNTER — Emergency Department (HOSPITAL_BASED_OUTPATIENT_CLINIC_OR_DEPARTMENT_OTHER)
Admission: EM | Admit: 2022-12-17 | Discharge: 2022-12-17 | Disposition: A | Discharge status: Home / Self Care | Payer: 59 | Source: Home / Self Care | Attending: Emergency Medicine | Admitting: Emergency Medicine

## 2022-12-17 DIAGNOSIS — R1011 Right upper quadrant pain: Secondary | ICD-10-CM | POA: Insufficient documentation

## 2022-12-17 DIAGNOSIS — K8020 Calculus of gallbladder without cholecystitis without obstruction: Secondary | ICD-10-CM

## 2022-12-17 DIAGNOSIS — Z9049 Acquired absence of other specified parts of digestive tract: Secondary | ICD-10-CM | POA: Diagnosis not present

## 2022-12-17 DIAGNOSIS — K8064 Calculus of gallbladder and bile duct with chronic cholecystitis without obstruction: Secondary | ICD-10-CM | POA: Diagnosis not present

## 2022-12-17 DIAGNOSIS — R1013 Epigastric pain: Secondary | ICD-10-CM | POA: Insufficient documentation

## 2022-12-17 MED ORDER — ONDANSETRON HCL 4 MG/2ML IJ SOLN
4.0000 mg | Freq: Once | INTRAMUSCULAR | Status: DC
  Filled 2022-12-17: qty 2

## 2022-12-17 MED ORDER — HYDROMORPHONE HCL 1 MG/ML IJ SOLN
1.0000 mg | Freq: Once | INTRAMUSCULAR | Status: DC
  Filled 2022-12-17: qty 1

## 2022-12-17 NOTE — ED Notes (Signed)
Walked into room. Patient states her pain was much better and a 2/10. No medications given. Patient took medication at home. Informed the MD. Patient was discharged

## 2022-12-17 NOTE — ED Provider Notes (Signed)
[place] [facility] Provider Note   CSN: [id number] Arrival date & time: 12/17/22  4098     History  Chief Complaint  Patient presents with   Abdominal Pain    Beverly Stewart is a 37 y.o. female.  Patient is a 37 year old female with history of cholecystitis scheduled for cholecystectomy in 2 days.  Patient presenting here with complaints of epigastric/right upper quadrant pain.  This feels consistent with what she is experienced with her prior gallbladder attacks.  She tried taking Percocet at home but did not get relief within 30 minutes, so comes here.  No fevers or chills.  No diarrhea or blood in the stools.  The history is provided by the patient.       Home Medications Prior to Admission medications   Medication Sig Start Date End Date Taking? Authorizing Provider  acetaminophen (TYLENOL) 325 MG tablet Take 2 tablets (650 mg total) by mouth every 6 (six) hours as needed (for pain scale < 4). 05/02/21   Charlett Nose, MD  dicyclomine (BENTYL) 20 MG tablet Take 1 tablet (20 mg total) by mouth 2 (two) times daily. 10/31/22   Mesner, Barbara Cower, MD  famotidine (PEPCID) 20 MG tablet Take 1 tablet (20 mg total) by mouth 2 (two) times daily. Patient not taking: Reported on 11/21/2022 10/31/22   Mesner, Barbara Cower, MD  fluticasone [facility]) 50 MCG/ACT nasal spray Place 1 spray into both nostrils daily. Begin by using 2 sprays in each nare daily for 3 to 5 days, then decrease to 1 spray in each nare daily. Patient taking differently: Place 1 spray into both nostrils daily as needed for allergies. 09/17/21   Theadora Rama Scales, PA-C  Naphazoline HCl (CLEAR EYES OP) Place 1-2 drops into both eyes as needed (dry eye).    [provider]  omeprazole (PRILOSEC) 20 MG capsule Take 1 capsule (20 mg total) by mouth daily. 10/31/22   Mesner, Barbara Cower, MD  ondansetron (ZOFRAN) 4 MG tablet Take 1 tablet (4 mg total) by mouth every 8 (eight) hours as needed for  nausea or vomiting. 10/31/22   Mesner, Barbara Cower, MD  oxyCODONE-acetaminophen (PERCOCET) 5-325 MG tablet Take 1 tablet by mouth every 8 (eight) hours as needed for severe pain. 10/31/22   Mesner, Barbara Cower, MD      Allergies    Patient has no known allergies.    Review of Systems   Review of Systems  All other systems reviewed and are negative.   Physical Exam Updated Vital Signs BP (!) 129/90 (BP Location: Right Arm)   Pulse 74   Temp 97.8 F (36.6 C) (Oral)   Resp 20   Ht 5\' 6"  (1.676 m)   Wt 57.6 kg   LMP 12/15/2022 (Approximate)   SpO2 100%   BMI 20.50 kg/m  Physical Exam Vitals and nursing note reviewed.  Constitutional:      General: She is not in acute distress.    Appearance: She is well-developed. She is not diaphoretic.  HENT:     Head: Normocephalic and atraumatic.  Cardiovascular:     Rate and Rhythm: Normal rate and regular rhythm.     Heart sounds: No murmur heard.    No friction rub. No gallop.  Pulmonary:     Effort: Pulmonary effort is normal. No respiratory distress.     Breath sounds: Normal breath sounds. No wheezing.  Abdominal:     General: Bowel sounds are normal. There is no distension.  Palpations: Abdomen is soft.     Tenderness: There is abdominal tenderness in the right upper quadrant and epigastric area. There is no right CVA tenderness, left CVA tenderness, guarding or rebound.  Musculoskeletal:        General: Normal range of motion.     Cervical back: Normal range of motion and neck supple.  Skin:    General: Skin is warm and dry.  Neurological:     General: No focal deficit present.     Mental Status: She is alert and oriented to person, place, and time.     ED Results / Procedures / Treatments   Labs (all labs ordered are listed, but only abnormal results are displayed) Labs Reviewed - No data to display  EKG None  Radiology No results found.  Procedures Procedures    Medications Ordered in ED Medications  HYDROmorphone  (DILAUDID) injection 1 mg (has no administration in time range)  ondansetron (ZOFRAN) injection 4 mg (has no administration in time range)    ED Course/ Medical Decision Making/ A&P  Patient with history of gallstones scheduled for surgery on Wednesday presenting with right upper quadrant pain.  She took 2 Percocet prior to coming here, but did not help.  Patient arrives with stable vital signs and is afebrile.  She is tender to the right upper quadrant, but exam otherwise unremarkable.  My plan was to repeat her laboratory studies and administer IV pain medication, however prior to the IV being established the patient stated that her pain had resolved and she was feeling better.  At this point, she wants to go home and follow through with her surgery on Wednesday.  I believe this to be reasonable.  Final Clinical Impression(s) / ED Diagnoses Final diagnoses:  None    Rx / DC Orders ED Discharge Orders     None         Geoffery Lyons, MD 12/17/22 [phone]

## 2022-12-17 NOTE — ED Triage Notes (Signed)
Pt reports epigastric pain. She is scheduled to have a cholecystectomy in 2 days. She reports this feels like the same pain she has been having with her gallbladder. She took Percocet 5-325 and 4mg  Zofran PTA.

## 2022-12-17 NOTE — Discharge Instructions (Signed)
Continue medications as previously prescribed.  Follow-up with your surgery on Wednesday.

## 2022-12-18 NOTE — Anesthesia Preprocedure Evaluation (Signed)
Anesthesia Evaluation  Patient identified by MRN, date of birth, ID band Patient awake    Reviewed: Allergy & Precautions, NPO status , Patient's Chart, lab work & pertinent test results  Airway Mallampati: I  TM Distance: >3 FB Neck ROM: Full    Dental no notable dental hx.    Pulmonary former smoker   Pulmonary exam normal        Cardiovascular hypertension,  Rhythm:Regular Rate:Normal     Neuro/Psych negative neurological ROS  negative psych ROS   GI/Hepatic Neg liver ROS,GERD  Medicated,,Cholecystitis    Endo/Other  negative endocrine ROS    Renal/GU negative Renal ROS  negative genitourinary   Musculoskeletal negative musculoskeletal ROS (+)    Abdominal Normal abdominal exam  (+)   Peds  Hematology negative hematology ROS (+)   Anesthesia Other Findings   Reproductive/Obstetrics                             Anesthesia Physical Anesthesia Plan  ASA: 2  Anesthesia Plan: General   Post-op Pain Management: Tylenol PO (pre-op)* and Celebrex PO (pre-op)*   Induction: Intravenous  PONV Risk Score and Plan: 3 and Ondansetron, Dexamethasone, Midazolam and Treatment may vary due to age or medical condition  Airway Management Planned: Mask and Oral ETT  Additional Equipment: None  Intra-op Plan:   Post-operative Plan: Extubation in OR  Informed Consent: I have reviewed the patients History and Physical, chart, labs and discussed the procedure including the risks, benefits and alternatives for the proposed anesthesia with the patient or authorized representative who has indicated his/her understanding and acceptance.     Dental advisory given  Plan Discussed with: CRNA  Anesthesia Plan Comments:        Anesthesia Quick Evaluation

## 2022-12-18 NOTE — H&P (Signed)
PROVIDER: Wayne Both, MD  MRN: [id number] DOB: Sep 05, 1985  Subjective   Chief Complaint: NEW PROBLEM   History of Present Illness: Beverly Stewart is a 37 y.o. female who is seen  for hospital follow-up regarding symptomatic gallstones. She has been having some gallbladder issues for years but most recently had to be admitted to the hospital for nausea vomiting and abdominal pain. At that time, she had some mildly elevated liver function test and she was suspected to have a common bile duct stone but then the MRCP showed normal bile duct. She was asymptomatic at this point and wanted to hold on surgery while in the hospital secondary to childcare issues. She reports since coming home she has had only 1 mild attack of epigastric abdominal pain and has been on a low-fat diet.    Review of Systems: A complete review of systems was obtained from the patient. I have reviewed this information and discussed as appropriate with the patient. See HPI as well for other ROS.  ROS   Medical History: History reviewed. No pertinent past medical history.  There is no problem list on file for this patient.  History reviewed. No pertinent surgical history.   No Known Allergies  Current Outpatient Medications on File Prior to Visit  Medication Sig Dispense Refill  dicyclomine (BENTYL) 20 mg tablet Take 20 mg by mouth 2 (two) times daily  omeprazole (PRILOSEC) 20 MG DR capsule Take 1 capsule by mouth once daily   No current facility-administered medications on file prior to visit.   Family History  Problem Relation Age of Onset  Obesity Mother  High blood pressure (Hypertension) Mother  Diabetes Father    Social History   Tobacco Use  Smoking Status Never  Smokeless Tobacco Never    Social History   Socioeconomic History  Marital status: Single  Tobacco Use  Smoking status: Never  Smokeless tobacco: Never  Substance and Sexual Activity  Alcohol use: Yes  Drug use:  Never   Social Determinants of Health   Financial Resource Strain: Low Risk (07/05/2021)  Received from [facility]  Overall Financial Resource Strain (CARDIA)  Difficulty of Paying Living Expenses: Not hard at all  Food Insecurity: No Food Insecurity (07/05/2021)  Received from [facility]  Hunger Vital Sign  Worried About Running Out of Food in the Last Year: Never true  Ran Out of Food in the Last Year: Never true  Physical Activity: Unknown (07/05/2021)  Received from [facility]  Exercise Vital Sign  Days of Exercise per Week: 4 days  Stress: No Stress Concern Present (07/05/2021)  Received from [facility] of Occupational Health - Occupational Stress Questionnaire  Feeling of Stress : Only a little  Received from [employer]  Social Network   Objective:   Vitals:   BP: 118/75  Pulse: 78  Temp: 36.9 C (98.4 F)  SpO2: 98%  Weight: 57.3 kg (126 lb 6.4 oz)  Height: 167.6 cm (5\' 6" )  PainSc: 0-No pain  PainLoc: Abdomen   Body mass index is 20.4 kg/m.  Physical Exam   She appears well and comfortable today  Her abdomen is soft and nontender. There is no hepatomegaly and no hernias  Labs, Imaging and Diagnostic Testing:  I again reviewed her imaging studies including the ultrasound and MRCP from her most recent admission  Assessment and Plan:   Diagnoses and all orders for this visit:  Symptomatic cholelithiasis    At this point, we again  discussed symptomatic gallstones and again we recommend a laparoscopic cholecystectomy with cholangiogram. I again gave her literature regarding this we discussed the surgical procedure in detail. We discussed the risks which includes but is not limited to bleeding, infection, injury to surrounding structures, bile duct injury, bile leak, the need to convert to an open procedure, postoperative recovery issues, etc. She will stay on a low-fat diet until surgery. She understands and agrees with the  plans. Surgery will be scheduled

## 2022-12-19 ENCOUNTER — Other Ambulatory Visit: Payer: Self-pay

## 2022-12-19 ENCOUNTER — Encounter (HOSPITAL_COMMUNITY): Payer: Self-pay | Admitting: Surgery

## 2022-12-19 ENCOUNTER — Ambulatory Visit (HOSPITAL_COMMUNITY): Payer: 59 | Admitting: Anesthesiology

## 2022-12-19 ENCOUNTER — Inpatient Hospital Stay (HOSPITAL_COMMUNITY)
Admission: AD | Admit: 2022-12-19 | Discharge: 2022-12-21 | DRG: 419 | Disposition: A | Discharge status: Home / Self Care | Payer: 59 | Attending: Surgery | Admitting: Surgery

## 2022-12-19 ENCOUNTER — Ambulatory Visit (HOSPITAL_COMMUNITY): Payer: 59

## 2022-12-19 ENCOUNTER — Encounter (HOSPITAL_COMMUNITY)
Admission: AD | Disposition: A | Discharge status: Home / Self Care | Payer: Self-pay | Source: Home / Self Care | Attending: Surgery

## 2022-12-19 DIAGNOSIS — K8034 Calculus of bile duct with chronic cholangitis without obstruction: Secondary | ICD-10-CM

## 2022-12-19 DIAGNOSIS — Z9049 Acquired absence of other specified parts of digestive tract: Secondary | ICD-10-CM

## 2022-12-19 DIAGNOSIS — Z8249 Family history of ischemic heart disease and other diseases of the circulatory system: Secondary | ICD-10-CM

## 2022-12-19 DIAGNOSIS — Z8616 Personal history of COVID-19: Secondary | ICD-10-CM

## 2022-12-19 DIAGNOSIS — Z79899 Other long term (current) drug therapy: Secondary | ICD-10-CM

## 2022-12-19 DIAGNOSIS — K8064 Calculus of gallbladder and bile duct with chronic cholecystitis without obstruction: Principal | ICD-10-CM | POA: Diagnosis present

## 2022-12-19 DIAGNOSIS — K8050 Calculus of bile duct without cholangitis or cholecystitis without obstruction: Secondary | ICD-10-CM | POA: Diagnosis not present

## 2022-12-19 DIAGNOSIS — J302 Other seasonal allergic rhinitis: Secondary | ICD-10-CM | POA: Diagnosis present

## 2022-12-19 DIAGNOSIS — R7401 Elevation of levels of liver transaminase levels: Secondary | ICD-10-CM | POA: Diagnosis present

## 2022-12-19 DIAGNOSIS — R7989 Other specified abnormal findings of blood chemistry: Secondary | ICD-10-CM | POA: Diagnosis not present

## 2022-12-19 DIAGNOSIS — R932 Abnormal findings on diagnostic imaging of liver and biliary tract: Secondary | ICD-10-CM

## 2022-12-19 DIAGNOSIS — Z87891 Personal history of nicotine dependence: Secondary | ICD-10-CM

## 2022-12-19 DIAGNOSIS — Z01818 Encounter for other preprocedural examination: Principal | ICD-10-CM

## 2022-12-19 HISTORY — PX: CHOLECYSTECTOMY: SHX55

## 2022-12-19 LAB — CBC WITH DIFFERENTIAL/PLATELET
Abs Immature Granulocytes: 0.03 10*3/uL (ref 0.00–0.07)
Basophils Absolute: 0 10*3/uL (ref 0.0–0.1)
Basophils Relative: 0 %
Eosinophils Absolute: 0 10*3/uL (ref 0.0–0.5)
Eosinophils Relative: 0 %
HCT: 38 % (ref 36.0–46.0)
Hemoglobin: 12.4 g/dL (ref 12.0–15.0)
Immature Granulocytes: 0 %
Lymphocytes Relative: 7 %
Lymphs Abs: 0.5 10*3/uL — ABNORMAL LOW (ref 0.7–4.0)
MCH: 30.2 pg (ref 26.0–34.0)
MCHC: 32.6 g/dL (ref 30.0–36.0)
MCV: 92.5 fL (ref 80.0–100.0)
Monocytes Absolute: 0.3 10*3/uL (ref 0.1–1.0)
Monocytes Relative: 4 %
Neutro Abs: 6.6 10*3/uL (ref 1.7–7.7)
Neutrophils Relative %: 89 %
Platelets: 232 10*3/uL (ref 150–400)
RBC: 4.11 MIL/uL (ref 3.87–5.11)
RDW: 12 % (ref 11.5–15.5)
WBC: 7.4 10*3/uL (ref 4.0–10.5)
nRBC: 0 % (ref 0.0–0.2)

## 2022-12-19 LAB — COMPREHENSIVE METABOLIC PANEL
ALT: 1166 U/L — ABNORMAL HIGH (ref 0–44)
AST: 1315 U/L — ABNORMAL HIGH (ref 15–41)
Albumin: 4 g/dL (ref 3.5–5.0)
Alkaline Phosphatase: 60 U/L (ref 38–126)
Anion gap: 8 (ref 5–15)
BUN: 8 mg/dL (ref 6–20)
CO2: 22 mmol/L (ref 22–32)
Calcium: 9.1 mg/dL (ref 8.9–10.3)
Chloride: 107 mmol/L (ref 98–111)
Creatinine, Ser: 0.62 mg/dL (ref 0.44–1.00)
GFR, Estimated: >60 mL/min (ref >60)
Glucose, Bld: 126 mg/dL — ABNORMAL HIGH (ref 70–99)
Potassium: 3.8 mmol/L (ref 3.5–5.1)
Sodium: 137 mmol/L (ref 135–145)
Total Bilirubin: 1.8 mg/dL — ABNORMAL HIGH (ref 0.3–1.2)
Total Protein: 7.1 g/dL (ref 6.5–8.1)

## 2022-12-19 LAB — POCT PREGNANCY, URINE: Preg Test, Ur: NEGATIVE

## 2022-12-19 SURGERY — LAPAROSCOPIC CHOLECYSTECTOMY WITH INTRAOPERATIVE CHOLANGIOGRAM
Anesthesia: General | Site: Abdomen

## 2022-12-19 MED ORDER — FENTANYL CITRATE PF 50 MCG/ML IJ SOSY: 25.0000 ug | PREFILLED_SYRINGE | INTRAMUSCULAR | Status: DC | PRN

## 2022-12-19 MED ORDER — DEXAMETHASONE SODIUM PHOSPHATE 10 MG/ML IJ SOLN
INTRAMUSCULAR | Status: AC
  Filled 2022-12-19: qty 1

## 2022-12-19 MED ORDER — ACETAMINOPHEN 500 MG PO TABS: 1000.0000 mg | ORAL_TABLET | Freq: Once | ORAL | Status: DC

## 2022-12-19 MED ORDER — ONDANSETRON HCL 4 MG/2ML IJ SOLN
INTRAMUSCULAR | Status: AC
  Filled 2022-12-19: qty 2

## 2022-12-19 MED ORDER — TRAMADOL HCL 50 MG PO TABS
50.0000 mg | ORAL_TABLET | Freq: Four times a day (QID) | ORAL | Status: DC | PRN
  Administered 2022-12-19: 50 mg via ORAL
  Filled 2022-12-19: qty 1

## 2022-12-19 MED ORDER — KETOROLAC TROMETHAMINE 30 MG/ML IJ SOLN
INTRAMUSCULAR | Status: DC | PRN
Start: 2022-12-19 — End: 2022-12-19
  Administered 2022-12-19: 30 mg via INTRAVENOUS

## 2022-12-19 MED ORDER — CHLORHEXIDINE GLUCONATE 0.12 % MT SOLN
15.0000 mL | Freq: Once | OROMUCOSAL | Status: AC
  Administered 2022-12-19: 15 mL via OROMUCOSAL

## 2022-12-19 MED ORDER — MIDAZOLAM HCL 2 MG/2ML IJ SOLN
INTRAMUSCULAR | Status: AC
  Filled 2022-12-19: qty 2

## 2022-12-19 MED ORDER — FENTANYL CITRATE (PF) 100 MCG/2ML IJ SOLN
INTRAMUSCULAR | Status: AC
  Filled 2022-12-19: qty 2

## 2022-12-19 MED ORDER — EPHEDRINE 5 MG/ML INJ
INTRAVENOUS | Status: AC
  Filled 2022-12-19: qty 5

## 2022-12-19 MED ORDER — ENOXAPARIN SODIUM 40 MG/0.4ML IJ SOSY: 40.0000 mg | PREFILLED_SYRINGE | INTRAMUSCULAR | Status: DC

## 2022-12-19 MED ORDER — ONDANSETRON HCL 4 MG/2ML IJ SOLN: 4.0000 mg | Freq: Four times a day (QID) | INTRAMUSCULAR | Status: DC | PRN

## 2022-12-19 MED ORDER — OXYCODONE HCL 5 MG PO TABS: 5.0000 mg | ORAL_TABLET | Freq: Once | ORAL | Status: DC | PRN

## 2022-12-19 MED ORDER — CHLORHEXIDINE GLUCONATE CLOTH 2 % EX PADS: 6.0000 | MEDICATED_PAD | Freq: Once | CUTANEOUS | Status: DC

## 2022-12-19 MED ORDER — ACETAMINOPHEN 500 MG PO TABS
1000.0000 mg | ORAL_TABLET | ORAL | Status: AC
  Administered 2022-12-19: 1000 mg via ORAL
  Filled 2022-12-19: qty 2

## 2022-12-19 MED ORDER — BUPIVACAINE HCL (PF) 0.5 % IJ SOLN
INTRAMUSCULAR | Status: AC
  Filled 2022-12-19: qty 30

## 2022-12-19 MED ORDER — IOHEXOL 300 MG/ML  SOLN
INTRAMUSCULAR | Status: DC | PRN
Start: 2022-12-19 — End: 2022-12-19
  Administered 2022-12-19: 15 mL

## 2022-12-19 MED ORDER — HYDROMORPHONE HCL 1 MG/ML IJ SOLN
1.0000 mg | INTRAMUSCULAR | Status: DC | PRN
  Administered 2022-12-19 (×3): 1 mg via INTRAVENOUS
  Filled 2022-12-19 (×3): qty 1

## 2022-12-19 MED ORDER — EPHEDRINE SULFATE-NACL 50-0.9 MG/10ML-% IV SOSY
PREFILLED_SYRINGE | INTRAVENOUS | Status: DC | PRN
  Administered 2022-12-19: 5 mg via INTRAVENOUS

## 2022-12-19 MED ORDER — SUGAMMADEX SODIUM 200 MG/2ML IV SOLN
INTRAVENOUS | Status: DC | PRN
  Administered 2022-12-19: 120 mg via INTRAVENOUS

## 2022-12-19 MED ORDER — PROPOFOL 10 MG/ML IV BOLUS
INTRAVENOUS | Status: AC
  Filled 2022-12-19: qty 20

## 2022-12-19 MED ORDER — ROCURONIUM BROMIDE 10 MG/ML (PF) SYRINGE
PREFILLED_SYRINGE | INTRAVENOUS | Status: DC | PRN
  Administered 2022-12-19: 50 mg via INTRAVENOUS

## 2022-12-19 MED ORDER — PROPOFOL 10 MG/ML IV BOLUS
INTRAVENOUS | Status: DC | PRN
  Administered 2022-12-19: 130 mg via INTRAVENOUS

## 2022-12-19 MED ORDER — BUPIVACAINE HCL (PF) 0.5 % IJ SOLN
INTRAMUSCULAR | Status: DC | PRN
  Administered 2022-12-19: 20 mL

## 2022-12-19 MED ORDER — FENTANYL CITRATE (PF) 100 MCG/2ML IJ SOLN
INTRAMUSCULAR | Status: DC | PRN
  Administered 2022-12-19: 100 ug via INTRAVENOUS

## 2022-12-19 MED ORDER — LIDOCAINE HCL (PF) 2 % IJ SOLN
INTRAMUSCULAR | Status: AC
  Filled 2022-12-19: qty 5

## 2022-12-19 MED ORDER — ONDANSETRON HCL 4 MG/2ML IJ SOLN
INTRAMUSCULAR | Status: DC | PRN
  Administered 2022-12-19: 4 mg via INTRAVENOUS

## 2022-12-19 MED ORDER — POTASSIUM CHLORIDE IN NACL 20-0.9 MEQ/L-% IV SOLN
INTRAVENOUS | Status: DC
  Filled 2022-12-19 (×4): qty 1000

## 2022-12-19 MED ORDER — DEXAMETHASONE SODIUM PHOSPHATE 10 MG/ML IJ SOLN
INTRAMUSCULAR | Status: DC | PRN
  Administered 2022-12-19: 6 mg via INTRAVENOUS

## 2022-12-19 MED ORDER — 0.9 % SODIUM CHLORIDE (POUR BTL) OPTIME
TOPICAL | Status: DC | PRN
  Administered 2022-12-19: 1000 mL

## 2022-12-19 MED ORDER — LACTATED RINGERS IV SOLN: INTRAVENOUS | Status: DC

## 2022-12-19 MED ORDER — DROPERIDOL 2.5 MG/ML IJ SOLN: 0.6250 mg | Freq: Once | INTRAMUSCULAR | Status: DC | PRN

## 2022-12-19 MED ORDER — ROCURONIUM BROMIDE 10 MG/ML (PF) SYRINGE
PREFILLED_SYRINGE | INTRAVENOUS | Status: AC
  Filled 2022-12-19: qty 10

## 2022-12-19 MED ORDER — LIDOCAINE 2% (20 MG/ML) 5 ML SYRINGE
INTRAMUSCULAR | Status: DC | PRN
  Administered 2022-12-19: 60 mg via INTRAVENOUS

## 2022-12-19 MED ORDER — ENSURE PRE-SURGERY PO LIQD
296.0000 mL | Freq: Once | ORAL | Status: DC
  Filled 2022-12-19: qty 296

## 2022-12-19 MED ORDER — OXYCODONE HCL 5 MG/5ML PO SOLN: 5.0000 mg | Freq: Once | ORAL | Status: DC | PRN

## 2022-12-19 MED ORDER — OXYCODONE HCL 5 MG PO TABS
5.0000 mg | ORAL_TABLET | ORAL | Status: DC | PRN
  Administered 2022-12-19 – 2022-12-20 (×3): 5 mg via ORAL
  Filled 2022-12-19 (×3): qty 1

## 2022-12-19 MED ORDER — ONDANSETRON 4 MG PO TBDP: 4.0000 mg | ORAL_TABLET | Freq: Four times a day (QID) | ORAL | Status: DC | PRN

## 2022-12-19 MED ORDER — CELECOXIB 200 MG PO CAPS
200.0000 mg | ORAL_CAPSULE | Freq: Once | ORAL | Status: AC
  Administered 2022-12-19: 200 mg via ORAL
  Filled 2022-12-19: qty 1

## 2022-12-19 MED ORDER — ORAL CARE MOUTH RINSE: 15.0000 mL | Freq: Once | OROMUCOSAL | Status: AC

## 2022-12-19 MED ORDER — CEFAZOLIN SODIUM-DEXTROSE 2-4 GM/100ML-% IV SOLN
2.0000 g | INTRAVENOUS | Status: AC
  Administered 2022-12-19: 2 g via INTRAVENOUS
  Filled 2022-12-19: qty 100

## 2022-12-19 MED ORDER — KETOROLAC TROMETHAMINE 30 MG/ML IJ SOLN
INTRAMUSCULAR | Status: AC
  Filled 2022-12-19: qty 1

## 2022-12-19 MED ORDER — MIDAZOLAM HCL 2 MG/2ML IJ SOLN
INTRAMUSCULAR | Status: DC | PRN
  Administered 2022-12-19: 2 mg via INTRAVENOUS

## 2022-12-19 SURGICAL SUPPLY — 35 items
ADH SKN CLS APL DERMABOND .7 (GAUZE/BANDAGES/DRESSINGS) ×1
APL PRP STRL LF DISP 70% ISPRP (MISCELLANEOUS) ×1
APPLIER CLIP 5 13 M/L LIGAMAX5 (MISCELLANEOUS) ×1
APR CLP MED LRG 5 ANG JAW (MISCELLANEOUS) ×1
BAG COUNTER SPONGE SURGICOUNT (BAG) IMPLANT
BAG SPNG CNTER NS LX DISP (BAG)
CABLE HIGH FREQUENCY MONO STRZ (ELECTRODE) ×1 IMPLANT
CHLORAPREP W/TINT 26 (MISCELLANEOUS) ×1 IMPLANT
CLIP APPLIE 5 13 M/L LIGAMAX5 (MISCELLANEOUS) ×1 IMPLANT
COVER MAYO STAND XLG (MISCELLANEOUS) ×1 IMPLANT
DERMABOND ADVANCED .7 DNX12 (GAUZE/BANDAGES/DRESSINGS) ×1 IMPLANT
DRAPE C-ARM 42X120 X-RAY (DRAPES) IMPLANT
ELECT REM PT RETURN 15FT ADLT (MISCELLANEOUS) ×1 IMPLANT
GLOVE BIO SURGEON STRL SZ7.5 (GLOVE) ×1 IMPLANT
GOWN STRL REUS W/ TWL XL LVL3 (GOWN DISPOSABLE) ×1 IMPLANT
GOWN STRL REUS W/TWL XL LVL3 (GOWN DISPOSABLE) ×1
HEMOSTAT SURGICEL 4X8 (HEMOSTASIS) IMPLANT
IRRIG SUCT STRYKERFLOW 2 WTIP (MISCELLANEOUS) ×1
IRRIGATION SUCT STRKRFLW 2 WTP (MISCELLANEOUS) ×1 IMPLANT
KIT BASIN OR (CUSTOM PROCEDURE TRAY) ×1 IMPLANT
KIT TURNOVER KIT A (KITS) IMPLANT
PENCIL SMOKE EVACUATOR (MISCELLANEOUS) IMPLANT
SCISSORS LAP 5X35 DISP (ENDOMECHANICALS) ×1 IMPLANT
SET CHOLANGIOGRAPH MIX (MISCELLANEOUS) IMPLANT
SET TUBE SMOKE EVAC HIGH FLOW (TUBING) ×1 IMPLANT
SLEEVE Z-THREAD 5X100MM (TROCAR) ×2 IMPLANT
SPIKE FLUID TRANSFER (MISCELLANEOUS) ×1 IMPLANT
SUT MNCRL AB 4-0 PS2 18 (SUTURE) ×1 IMPLANT
SYS BAG RETRIEVAL 10MM (BASKET) ×1
SYSTEM BAG RETRIEVAL 10MM (BASKET) ×1 IMPLANT
TOWEL OR 17X26 10 PK STRL BLUE (TOWEL DISPOSABLE) ×1 IMPLANT
TOWEL OR NON WOVEN STRL DISP B (DISPOSABLE) ×1 IMPLANT
TRAY LAPAROSCOPIC (CUSTOM PROCEDURE TRAY) ×1 IMPLANT
TROCAR BALLN 12MMX100 BLUNT (TROCAR) ×1 IMPLANT
TROCAR Z-THREAD OPTICAL 5X100M (TROCAR) ×1 IMPLANT

## 2022-12-19 NOTE — Anesthesia Preprocedure Evaluation (Signed)
Anesthesia Evaluation  Patient identified by MRN, date of birth, ID band Patient awake    Reviewed: Allergy & Precautions, NPO status , Patient's Chart, lab work & pertinent test results  Airway Mallampati: II  TM Distance: >3 FB Neck ROM: Full    Dental no notable dental hx. (+) Teeth Intact, Dental Advisory Given   Pulmonary former smoker   Pulmonary exam normal breath sounds clear to auscultation       Cardiovascular Normal cardiovascular exam Rhythm:Regular Rate:Normal     Neuro/Psych    GI/Hepatic ,GERD  ,,Lab Results      Component                Value               Date                      ALT                      1,166 (H)           12/19/2022                AST                      1,315 (H)           12/19/2022                ALKPHOS                  60                  12/19/2022                BILITOT                  1.8 (H)             12/19/2022              Endo/Other  negative endocrine ROS    Renal/GU negative Renal ROS     Musculoskeletal negative musculoskeletal ROS (+)    Abdominal   Peds  Hematology negative hematology ROS (+) Lab Results      Component                Value               Date                      WBC                      7.4                 12/19/2022                HGB                      12.4                12/19/2022                HCT                      38.0                12/19/2022  MCV                      92.5                12/19/2022                PLT                      232                 12/19/2022              Anesthesia Other Findings   Reproductive/Obstetrics negative OB ROS                              Anesthesia Physical Anesthesia Plan  ASA: 2  Anesthesia Plan: General   Post-op Pain Management: Minimal or no pain anticipated   Induction: Intravenous  PONV Risk Score and Plan: 3 and Treatment may  vary due to age or medical condition, Propofol infusion, Ondansetron and Midazolam  Airway Management Planned:   Additional Equipment: None  Intra-op Plan:   Post-operative Plan: Extubation in OR  Informed Consent: I have reviewed the patients History and Physical, chart, labs and discussed the procedure including the risks, benefits and alternatives for the proposed anesthesia with the patient or authorized representative who has indicated his/her understanding and acceptance.     Dental advisory given  Plan Discussed with: CRNA  Anesthesia Plan Comments: (CBD Stones for ERCP)         Anesthesia Quick Evaluation

## 2022-12-19 NOTE — Consult Note (Addendum)
Consultation  Referring Provider: [facility] Primary Care Physician:  [facility] Primary Gastroenterologist:  Dr.Danis  Reason for Consultation: Status post lap chole today, positive IOC  HPI: Beverly Stewart is a 37 y.o. female, established with Dr. Myrtie Neither, and last seen in our office in 2021.  We are asked to see her today after she underwent laparoscopic cholecystectomy this morning for chronic cholecystitis with IOC.  IOC showed mild diffuse biliary dilation with suspicion for a distal common bile duct stone. Patient was evaluated by GI when she was hospitalized in August 2024 with elevated LFTs and question of CBD stone.  At that time she had presented with acute onset of mid abdominal pain nausea vomiting and diarrhea.  She related recurrent episodes of upper abdominal pain nausea and vomiting. She had significantly elevated LFTs at that time with AST 1278/ALT 948, normal alkaline phosphatase, and T. bili of 2.1. She was COVID-19 positive. CT at that time showed distended gallbladder with subtle pericholecystic edema, and probable choledocholithiasis with no significant biliary ductal dilation.  MRI/MRCP with multiple tiny gallstones in the gallbladder, no choledocholithiasis, and no biliary ductal dilation  At that point multiple serologies were sent off to further investigate the elevated LFTs.  All of those returned negative, I do not see that a new anti-smooth muscle antibody was ordered. Was seen by surgery and decision was made since she was better and had childcare issues to delay surgery. Her last set of LFTs had been done on 10/30/2022 which showed T. bili 1.4/AST 427/ALT 301/alk phos 71.  He was seen by surgery as an outpatient and arranged to have laparoscopic cholecystectomy today. He says that she did continue to have episodes of upper abdominal pain radiating to her back associated with nausea and vomiting these were occurring every couple of weeks and  lasting for few hours.  She had an episode about 3 days prior to this admission.  In between these episodes she has been feeling fine.  She did well with surgery today, complains of soreness but looks very good, looking forward to ordering clear liquids. Did not have any labs ordered preoperatively today,  He had EGD and colonoscopy done in 2021 per Dr. Sheela Stack was unremarkable, and colonoscopy unremarkable other than a few right-sided diverticuli   Past Medical History:  Diagnosis Date   Allergy    History of chlamydia    Infection    UTI   Seasonal allergies    Vaginal Pap smear, abnormal    colpo, ok since    Past Surgical History:  Procedure Laterality Date   COLONOSCOPY     colposcopy N/A    WISDOM TOOTH EXTRACTION      Prior to Admission medications   Medication Sig Start Date End Date Taking? Authorizing Provider  acetaminophen (TYLENOL) 325 MG tablet Take 2 tablets (650 mg total) by mouth every 6 (six) hours as needed (for pain scale < 4). 05/02/21  Yes Charlett Nose, MD  dicyclomine (BENTYL) 20 MG tablet Take 1 tablet (20 mg total) by mouth 2 (two) times daily. 10/31/22  Yes Mesner, Barbara Cower, MD  Naphazoline HCl (CLEAR EYES OP) Place 1-2 drops into both eyes as needed (dry eye).   Yes [provider]  omeprazole (PRILOSEC) 20 MG capsule Take 1 capsule (20 mg total) by mouth daily. 10/31/22  Yes Mesner, Barbara Cower, MD  ondansetron (ZOFRAN) 4 MG tablet Take 1 tablet (4 mg total) by mouth every 8 (eight) hours as needed for nausea  or vomiting. 10/31/22  Yes Mesner, Barbara Cower, MD  oxyCODONE-acetaminophen (PERCOCET) 5-325 MG tablet Take 1 tablet by mouth every 8 (eight) hours as needed for severe pain. 10/31/22  Yes Mesner, Barbara Cower, MD  famotidine (PEPCID) 20 MG tablet Take 1 tablet (20 mg total) by mouth 2 (two) times daily. Patient not taking: Reported on 11/21/2022 10/31/22   Mesner, Barbara Cower, MD  fluticasone [facility]) 50 MCG/ACT nasal spray Place 1 spray into both nostrils daily.  Begin by using 2 sprays in each nare daily for 3 to 5 days, then decrease to 1 spray in each nare daily. Patient taking differently: Place 1 spray into both nostrils daily as needed for allergies. 09/17/21   Theadora Rama Scales, PA-C    Current Facility-Administered Medications  Medication Dose Route Frequency Provider Last Rate Last Admin   0.9 % NaCl with KCl 20 mEq/ L  infusion   Intravenous Continuous Abigail Miyamoto, MD 75 mL/hr at 12/19/22 1118 New Bag at 12/19/22 1118   [START ON 12/20/2022] enoxaparin (LOVENOX) injection 40 mg  40 mg Subcutaneous Q24H Abigail Miyamoto, MD       HYDROmorphone (DILAUDID) injection 1 mg  1 mg Intravenous Q2H PRN Abigail Miyamoto, MD   1 mg at 12/19/22 1308   ondansetron (ZOFRAN-ODT) disintegrating tablet 4 mg  4 mg Oral Q6H PRN Abigail Miyamoto, MD       Or   ondansetron [facility]) injection 4 mg  4 mg Intravenous Q6H PRN Abigail Miyamoto, MD       oxyCODONE (Oxy IR/ROXICODONE) immediate release tablet 5-10 mg  5-10 mg Oral Q4H PRN Abigail Miyamoto, MD   5 mg at 12/19/22 1219   traMADol (ULTRAM) tablet 50 mg  50 mg Oral Q6H PRN Abigail Miyamoto, MD   50 mg at 12/19/22 1120    Allergies as of 11/15/2022   (No Known Allergies)    Family History  Problem Relation Age of Onset   Diabetes Father    Hypertension Maternal Grandmother    Diabetes Maternal Grandmother    Kidney failure Maternal Grandmother        dialysis   Asthma Maternal Grandmother    Kidney disease Maternal Grandmother    Stomach cancer Maternal Grandmother    Stroke Maternal Grandfather    Colon cancer Maternal Grandfather        mets   Birth defects Maternal Grandfather    Hypertension Mother    Seizures Mother    Breast cancer Other    Esophageal cancer Neg Hx    Rectal cancer Neg Hx     Social History   Socioeconomic History   Marital status: Single    Spouse name: Not on file   Number of children: 0   Years of education: Not on file   Highest education  level: Not on file  Occupational History   Occupation: [employer]  Tobacco Use   Smoking status: Former    Current packs/day: 0.00    Types: Cigarettes    Quit date: 04/27/2015    Years since quitting: 7.6   Smokeless tobacco: Never   Tobacco comments:    only tried for a wk  Vaping Use   Vaping status: Never Used  Substance and Sexual Activity   Alcohol use: Yes    Comment: rare   Drug use: No   Sexual activity: Yes    Birth control/protection: Condom  Other Topics Concern   Not on file  Social History Narrative   Not on file   Social Determinants  of Health   Financial Resource Strain: Low Risk  (11/13/2022)   Received [facility]   Overall Financial Resource Strain (CARDIA)    Difficulty of Paying Living Expenses: Not hard at all  Food Insecurity: No Food Insecurity (12/19/2022)   Hunger Vital Sign    Worried About Running Out of Food in the Last Year: Never true    Ran Out of Food in the Last Year: Never true  Transportation Needs: No Transportation Needs (12/19/2022)   PRAPARE - Administrator, Civil Service (Medical): No    Lack of Transportation (Non-Medical): No  Physical Activity: Unknown (07/05/2021)   Received from [facility]   Exercise Vital Sign    Days of Exercise per Week: 4 days    Minutes of Exercise per Session: Not on file  Stress: No Stress Concern Present (07/05/2021)   Received from [facility] of Occupational Health - Occupational Stress Questionnaire    Feeling of Stress : Only a little  Social Connections: Unknown (07/06/2022)   Received from [facility]   Social Network    Social Network: Not on file  Intimate Partner Violence: Not At Risk (12/19/2022)   Humiliation, Afraid, Rape, and Kick questionnaire    Fear of Current or Ex-Partner: No    Emotionally Abused: No    Physically Abused: No    Sexually Abused: No    Review of Systems: Pertinent positive and negative review  of systems were noted in the above HPI section.  All other review of systems was otherwise negative.   Physical Exam: Vital signs in last 24 hours: Temp:  [97.4 F (36.3 C)-98.3 F (36.8 C)] 98.3 F (36.8 C) (10/02 1415) Pulse Rate:  [61-89] 74 (10/02 1415) Resp:  [14-16] 16 (10/02 1308) BP: (118-139)/(72-96) 118/72 (10/02 1415) SpO2:  [97 %-100 %] 97 % (10/02 1415) Weight:  [57.6 kg] 57.6 kg (10/02 0960) Last BM Date : 12/16/22 General:   Alert,  Well-developed, well-nourished young African-American female, pleasant and cooperative in NAD Head:  Normocephalic and atraumatic. Eyes:  Sclera clear, no icterus.   Conjunctiva pink. Ears:  Normal auditory acuity. Nose:  No deformity, discharge,  or lesions. Mouth:  No deformity or lesions.   Neck:  Supple; no masses or thyromegaly. Lungs:  Clear throughout to auscultation.   No wheezes, crackles, or rhonchi.  Heart:  Regular rate and rhythm; no murmurs, clicks, rubs,  or gallops. Abdomen:  Soft, mild no focal tenderness as expected postoperatively nondistended, BS active,nonpalp mass or hsm.   Rectal: Not done today Msk:  Symmetrical without gross deformities. . Pulses:  Normal pulses noted. Extremities:  Without clubbing or edema. Neurologic:  Alert and  oriented x4;  grossly normal neurologically. Skin:  Intact without significant lesions or rashes.. Psych:  Alert and cooperative. Normal mood and affect.  Intake/Output from previous day: No intake/output data recorded. Intake/Output this shift: Total I/O In: 1250 [P.O.:300; I.V.:850; IV Piggyback:100] Out: 310 [Urine:300; Blood:10]  Lab Results: No results for input(s): "WBC", "HGB", "HCT", "PLT" in the last 72 hours. BMET No results for input(s): "NA", "K", "CL", "CO2", "GLUCOSE", "BUN", "CREATININE", "CALCIUM" in the last 72 hours. LFT No results for input(s): "PROT", "ALBUMIN", "AST", "ALT", "ALKPHOS", "BILITOT", "BILIDIR", "IBILI" in the last 72 hours. PT/INR No results  for input(s): "LABPROT", "INR" in the last 72 hours. Hepatitis Panel No results for input(s): "HEPBSAG", "HCVAB", "HEPAIGM", "HEPBIGM" in the last 72 hours.  IMPRESSION:  #47 37 year old female, status post laparoscopic cholecystectomy today with finding of chronic calculus cholecystitis, and at [facility] noted to have a mild diffuse dilation of the common bile duct and suspicion for distal common bile duct stone  Patient had an admission in August 2024 with abdominal pain nausea and vomiting. She was seen during that admission by GI and surgery.  She did have markedly elevated LFTs/primarily a transaminitis. She was COVID-19 positive at that time and this may have been an acute COVID related hepatitis. There was also concern at that time for cholecystitis but she improved quickly and decision was made not to proceed with surgery at that time. There is also concern for possible common bile duct stone per initial imaging with ultrasound and CT however MRI/MRCP during that admission showed gallstones, no ductal dilation and no choledocholithiasis.  Hepatic serologies had been sent off to rule out other causes for an acute versus subacute hepatitis and those were unremarkable We do not have confirmation of normalization of her LFTs since that time as last LFTs were done mid August 2024  It is possible that LFTs were elevated at that time due to passage of a common bile duct stone, versus acute COVID related hepatitis, versus other underlying chronic hepatic inflammatory process  Plan; clear liquids, n.p.o. after midnight Dr. Marina Goodell has reviewed the images, she is now scheduled for ERCP with stone extraction with Dr. Marina Goodell for tomorrow 12/20/2022/afternoon.  Procedure was discussed in detail with the patient today including indications risks and benefits and she is agreeable to proceed. Will order CBC and CMET now  Hold Lovenox in a.m. GI will follow with you  Amy Esterwood PA-C 12/19/2022, 2:19  PM  GI ATTENDING  I have reviewed all data, including IOC, and personally seen and examined the patient. Agree with comprehensive consultation note as outlined above. Recovering well post lap-chole. IOC demonstrated distal CBD stone and interval ductal dilation. Scheduled for ERCP with sphincterotomy and stone extraction tomorrow. The nature of the procedure, as well as the risks, benefits, and alternatives were carefully and thoroughly reviewed with the patient. Ample time for discussion and questions allowed. The patient understood, was satisfied, and agreed to proceed.  Wilhemina Bonito. Eda Keys., M.D. [facility] Division of Gastroenterology

## 2022-12-19 NOTE — Interval H&P Note (Signed)
History and Physical Interval Note:no change in H and P  12/19/2022 8:04 AM  Beverly Stewart  has presented today for surgery, with the diagnosis of SYMPTOMATIC GALLSTONES.  The various methods of treatment have been discussed with the patient and family. After consideration of risks, benefits and other options for treatment, the patient has consented to  Procedure(s): LAPAROSCOPIC CHOLECYSTECTOMY WITH INTRAOPERATIVE CHOLANGIOGRAM (N/A) as a surgical intervention.  The patient's history has been reviewed, patient examined, no change in status, stable for surgery.  I have reviewed the patient's chart and labs.  Questions were answered to the patient's satisfaction.     Abigail Miyamoto

## 2022-12-19 NOTE — Anesthesia Postprocedure Evaluation (Signed)
Anesthesia Post Note  Patient: Beverly Stewart  Procedure(s) Performed: LAPAROSCOPIC CHOLECYSTECTOMY WITH INTRAOPERATIVE CHOLANGIOGRAM (Abdomen)     Patient location during evaluation: PACU Anesthesia Type: General Level of consciousness: awake and alert Pain management: pain level controlled Vital Signs Assessment: post-procedure vital signs reviewed and stable Respiratory status: spontaneous breathing, nonlabored ventilation, respiratory function stable and patient connected to nasal cannula oxygen Cardiovascular status: blood pressure returned to baseline and stable Postop Assessment: no apparent nausea or vomiting Anesthetic complications: no   No notable events documented.  Last Vitals:  Vitals:   12/19/22 1308 12/19/22 1415  BP: 118/80 118/72  Pulse: 69 74  Resp: 16   Temp: 36.8 C 36.8 C  SpO2: 100% 97%    Last Pain:  Vitals:   12/19/22 1415  TempSrc: Oral  PainSc:                  Nelle Don Phuoc Huy

## 2022-12-19 NOTE — Op Note (Signed)
LAPAROSCOPIC CHOLECYSTECTOMY WITH INTRAOPERATIVE CHOLANGIOGRAM  Procedure Note  Beverly Stewart 12/19/2022   Pre-op Diagnosis: SYMPTOMATIC CHOLELITHIASIS     Post-op Diagnosis: SAME  Procedure(s): LAPAROSCOPIC CHOLECYSTECTOMY WITH INTRAOPERATIVE CHOLANGIOGRAM  Surgeon(s): Abigail Miyamoto, MD  Anesthesia: General  Staff:  Circulator: Thurnell Garbe, RN Radiology Technologist: Joretta Bachelor, RT Scrub Person: Brandon Melnick, RN  Estimated Blood Loss: Minimal               Specimens: SENT TO PATH  Indications: This is a 37 year old female who was initially admitted in August with symptomatic gallstones.  She had COVID at the time.  She had elevated liver function test.  Her ultrasound suggested a stone in the bile duct.  Follow-up MRCP showed no stones and a normal bile duct.  She had another ultrasound after that showed a normal bile duct.  Her liver function test start decreasing and she was asymptomatic so decision was made to discharge patient home with follow-up for elective upper scopic cholecystectomy  Findings: The patient had a floppy gallbladder consistent with mild chronic cholecystitis.  A cholangiogram was performed which showed some mild dilation of the bile duct but suggested a distal common bile duct stone confirmed by radiology.  Procedure: The patient was brought to the operating identifies the correct patient.  She was placed upon the operating table and general anesthesia was induced.  Her abdomen was prepped and draped in the usual sterile fashion.  I made a small vertical incision below the umbilicus with a scalpel.  I then took this down to the fascia which was opened the scalpel.  The peritoneum was then identified and opened bluntly under direct vision with a Kelly clamp.  A 0 Vicryl pursing suture was placed around the fascial opening.  The [facility] port was easily placed through the opening and into the abdomen and insufflation of the abdomen  was begun.  A 5 mm trocar was then placed the patient epigastrium and 2 more on the right upper quadrant under direct vision.  The patient had a floppy thick-walled appearing gallbladder with some adhesions to it.  The adhesions were taken down both bluntly with electrocautery and the gallbladder was easily elevated.  The cystic duct was then dissected out and a critical window was achieved around it.  It was a very small cystic duct.  I made an opening in the cystic duct with a laparoscopic scissors.  I then placed a cholangiocatheter in the right upper quadrant under direct vision.  This was placed into the opening of the cystic duct.  I then performed a cholangiogram with contrast.  Contrast seem to flow easily to the bile duct but I could not get it to go into the cystic duct.  Repeat imaging was then again performed.  It appeared there was a stone in the distal bile duct.  This was suspected by radiology as well when they reviewed the cholangiogram.  At this point the catheter was removed.  I clipped the cystic duct 3 times proximally and transected it.  The cystic artery was identified clipped proximally distally and then transected as well.  The gallbladder was then slowly dissected free from the liver bed with electrocautery.  Once it was freed from the liver bed was placed in an Endosac and removed through the incision at the umbilicus.  We then irrigated the abdomen with saline.  Again hemostasis appeared to be achieved.  All ports were removed under direct vision and the abdomen was  deflated.  A 0 Vicryl the umbilicus was tied in place closing the fascial defect.  All incisions were then anesthetized with Marcaine and closed with 4-0 Monocryl sutures.  Dermabond was then applied.  The patient tolerated the procedure well.  All the counts were correct at the end of the procedure.  The patient was then extubated in the operating room and taken in a stable condition to the recovery room.          Abigail Miyamoto   Date: 12/19/2022  Time: 9:39 AM

## 2022-12-19 NOTE — Transfer of Care (Signed)
Immediate Anesthesia Transfer of Care Note  Patient: Beverly Stewart  Procedure(s) Performed: LAPAROSCOPIC CHOLECYSTECTOMY WITH INTRAOPERATIVE CHOLANGIOGRAM (Abdomen)  Patient Location: PACU  Anesthesia Type:General  Level of Consciousness: drowsy  Airway & Oxygen Therapy: Patient Spontanous Breathing and Patient connected to face mask oxygen  Post-op Assessment: Report given to RN and Post -op Vital signs reviewed and stable  Post vital signs: Reviewed and stable  Last Vitals:  Vitals Value Taken Time  BP    Temp    Pulse 89 12/19/22 0932  Resp    SpO2 100 % 12/19/22 0932  Vitals shown include unfiled device data.  Last Pain:  Vitals:   12/19/22 0659  TempSrc: Oral         Complications: No notable events documented.

## 2022-12-19 NOTE — Anesthesia Procedure Notes (Signed)
Procedure Name: Intubation Date/Time: 12/19/2022 8:34 AM  Performed by: Florene Route, CRNAPre-anesthesia Checklist: Patient identified, Emergency Drugs available, Suction available and Patient being monitored Patient Re-evaluated:Patient Re-evaluated prior to induction Oxygen Delivery Method: Circle system utilized Preoxygenation: Pre-oxygenation with 100% oxygen Induction Type: IV induction Ventilation: Mask ventilation without difficulty and Oral airway inserted - appropriate to patient size Laryngoscope Size: Hyacinth Meeker and 2 Grade View: Grade I Tube type: Oral Tube size: 7.0 mm Number of attempts: 1 Airway Equipment and Method: Stylet and Oral airway Placement Confirmation: ETT inserted through vocal cords under direct vision, positive ETCO2 and breath sounds checked- equal and bilateral Secured at: 20 cm Tube secured with: Tape Dental Injury: Teeth and Oropharynx as per pre-operative assessment

## 2022-12-19 NOTE — Plan of Care (Signed)

## 2022-12-20 ENCOUNTER — Encounter (HOSPITAL_COMMUNITY)
Admission: AD | Disposition: A | Discharge status: Home / Self Care | Payer: Self-pay | Source: Home / Self Care | Attending: Surgery

## 2022-12-20 ENCOUNTER — Encounter (HOSPITAL_COMMUNITY): Payer: Self-pay | Admitting: Surgery

## 2022-12-20 ENCOUNTER — Inpatient Hospital Stay (HOSPITAL_COMMUNITY): Payer: 59

## 2022-12-20 ENCOUNTER — Inpatient Hospital Stay (HOSPITAL_COMMUNITY): Payer: 59 | Admitting: Anesthesiology

## 2022-12-20 DIAGNOSIS — Z79899 Other long term (current) drug therapy: Secondary | ICD-10-CM | POA: Diagnosis not present

## 2022-12-20 DIAGNOSIS — J302 Other seasonal allergic rhinitis: Secondary | ICD-10-CM | POA: Diagnosis present

## 2022-12-20 DIAGNOSIS — K8050 Calculus of bile duct without cholangitis or cholecystitis without obstruction: Secondary | ICD-10-CM

## 2022-12-20 DIAGNOSIS — Z8616 Personal history of COVID-19: Secondary | ICD-10-CM | POA: Diagnosis not present

## 2022-12-20 DIAGNOSIS — R7401 Elevation of levels of liver transaminase levels: Secondary | ICD-10-CM | POA: Diagnosis present

## 2022-12-20 DIAGNOSIS — Z9049 Acquired absence of other specified parts of digestive tract: Secondary | ICD-10-CM | POA: Diagnosis present

## 2022-12-20 DIAGNOSIS — Z8249 Family history of ischemic heart disease and other diseases of the circulatory system: Secondary | ICD-10-CM | POA: Diagnosis not present

## 2022-12-20 DIAGNOSIS — Z87891 Personal history of nicotine dependence: Secondary | ICD-10-CM | POA: Diagnosis not present

## 2022-12-20 DIAGNOSIS — K8064 Calculus of gallbladder and bile duct with chronic cholecystitis without obstruction: Secondary | ICD-10-CM | POA: Diagnosis present

## 2022-12-20 HISTORY — PX: ERCP: SHX5425

## 2022-12-20 HISTORY — PX: SPHINCTEROTOMY: SHX5544

## 2022-12-20 LAB — CBC
HCT: 36.7 % (ref 36.0–46.0)
Hemoglobin: 11.9 g/dL — ABNORMAL LOW (ref 12.0–15.0)
MCH: 30.5 pg (ref 26.0–34.0)
MCHC: 32.4 g/dL (ref 30.0–36.0)
MCV: 94.1 fL (ref 80.0–100.0)
Platelets: 206 10*3/uL (ref 150–400)
RBC: 3.9 MIL/uL (ref 3.87–5.11)
RDW: 12.1 % (ref 11.5–15.5)
WBC: 8 10*3/uL (ref 4.0–10.5)
nRBC: 0 % (ref 0.0–0.2)

## 2022-12-20 LAB — COMPREHENSIVE METABOLIC PANEL
ALT: 1213 U/L — ABNORMAL HIGH (ref 0–44)
AST: 745 U/L — ABNORMAL HIGH (ref 15–41)
Albumin: 3.4 g/dL — ABNORMAL LOW (ref 3.5–5.0)
Alkaline Phosphatase: 59 U/L (ref 38–126)
Anion gap: 10 (ref 5–15)
BUN: 8 mg/dL (ref 6–20)
CO2: 22 mmol/L (ref 22–32)
Calcium: 9 mg/dL (ref 8.9–10.3)
Chloride: 106 mmol/L (ref 98–111)
Creatinine, Ser: 0.66 mg/dL (ref 0.44–1.00)
GFR, Estimated: >60 mL/min (ref >60)
Glucose, Bld: 100 mg/dL — ABNORMAL HIGH (ref 70–99)
Potassium: 4 mmol/L (ref 3.5–5.1)
Sodium: 138 mmol/L (ref 135–145)
Total Bilirubin: 1.7 mg/dL — ABNORMAL HIGH (ref 0.3–1.2)
Total Protein: 6.2 g/dL — ABNORMAL LOW (ref 6.5–8.1)

## 2022-12-20 LAB — SURGICAL PATHOLOGY

## 2022-12-20 SURGERY — ERCP, WITH INTERVENTION IF INDICATED
Anesthesia: General

## 2022-12-20 SURGERY — ENDOSCOPIC RETROGRADE CHOLANGIOPANCREATOGRAPHY (ERCP)
Anesthesia: General

## 2022-12-20 MED ORDER — PROPOFOL 10 MG/ML IV BOLUS
INTRAVENOUS | Status: AC
  Filled 2022-12-20: qty 20

## 2022-12-20 MED ORDER — SODIUM CHLORIDE 0.9 % IV SOLN
INTRAVENOUS | Status: DC | PRN
  Administered 2022-12-20: 3 g via INTRAVENOUS

## 2022-12-20 MED ORDER — PHENYLEPHRINE HCL (PRESSORS) 10 MG/ML IV SOLN
INTRAVENOUS | Status: DC | PRN
  Administered 2022-12-20 (×2): 120 ug via INTRAVENOUS

## 2022-12-20 MED ORDER — MIDAZOLAM HCL 2 MG/2ML IJ SOLN
INTRAMUSCULAR | Status: AC
  Filled 2022-12-20: qty 2

## 2022-12-20 MED ORDER — FENTANYL CITRATE (PF) 100 MCG/2ML IJ SOLN
INTRAMUSCULAR | Status: DC | PRN
  Administered 2022-12-20 (×2): 50 ug via INTRAVENOUS

## 2022-12-20 MED ORDER — ROCURONIUM BROMIDE 100 MG/10ML IV SOLN
INTRAVENOUS | Status: DC | PRN
  Administered 2022-12-20: 50 mg via INTRAVENOUS

## 2022-12-20 MED ORDER — OXYCODONE HCL 5 MG PO TABS: 5.0000 mg | ORAL_TABLET | Freq: Once | ORAL | Status: DC | PRN

## 2022-12-20 MED ORDER — ONDANSETRON HCL 4 MG/2ML IJ SOLN: 4.0000 mg | Freq: Once | INTRAMUSCULAR | Status: DC | PRN

## 2022-12-20 MED ORDER — CIPROFLOXACIN IN D5W 400 MG/200ML IV SOLN
INTRAVENOUS | Status: AC
  Filled 2022-12-20: qty 200

## 2022-12-20 MED ORDER — FENTANYL CITRATE (PF) 100 MCG/2ML IJ SOLN: 25.0000 ug | INTRAMUSCULAR | Status: DC | PRN

## 2022-12-20 MED ORDER — DICLOFENAC SUPPOSITORY 100 MG
RECTAL | Status: AC
  Filled 2022-12-20: qty 1

## 2022-12-20 MED ORDER — OXYCODONE HCL 5 MG/5ML PO SOLN
5.0000 mg | Freq: Once | ORAL | Status: DC | PRN
  Filled 2022-12-20: qty 5

## 2022-12-20 MED ORDER — GLUCAGON HCL RDNA (DIAGNOSTIC) 1 MG IJ SOLR
INTRAMUSCULAR | Status: AC
  Filled 2022-12-20: qty 1

## 2022-12-20 MED ORDER — ONDANSETRON HCL 4 MG/2ML IJ SOLN
INTRAMUSCULAR | Status: DC | PRN
  Administered 2022-12-20: 4 mg via INTRAVENOUS

## 2022-12-20 MED ORDER — SODIUM CHLORIDE 0.9 % IV SOLN
INTRAVENOUS | Status: DC | PRN
  Administered 2022-12-20: 75 mL

## 2022-12-20 MED ORDER — PROPOFOL 10 MG/ML IV BOLUS
INTRAVENOUS | Status: DC | PRN
Start: 2022-12-20 — End: 2022-12-20
  Administered 2022-12-20: 150 mg via INTRAVENOUS

## 2022-12-20 MED ORDER — SODIUM CHLORIDE 0.9 % IV SOLN
3.0000 g | Freq: Once | INTRAVENOUS | Status: AC
  Administered 2022-12-20: 3 g via INTRAVENOUS
  Filled 2022-12-20: qty 8

## 2022-12-20 MED ORDER — DEXAMETHASONE SODIUM PHOSPHATE 10 MG/ML IJ SOLN
INTRAMUSCULAR | Status: DC | PRN
  Administered 2022-12-20: 8 mg via INTRAVENOUS

## 2022-12-20 MED ORDER — KETOROLAC TROMETHAMINE 30 MG/ML IJ SOLN
30.0000 mg | Freq: Once | INTRAMUSCULAR | Status: AC | PRN
  Filled 2022-12-20: qty 1

## 2022-12-20 MED ORDER — DICLOFENAC SUPPOSITORY 100 MG
RECTAL | Status: DC | PRN
  Administered 2022-12-20: 100 mg via RECTAL

## 2022-12-20 MED ORDER — MIDAZOLAM HCL 5 MG/5ML IJ SOLN
INTRAMUSCULAR | Status: DC | PRN
  Administered 2022-12-20: 2 mg via INTRAVENOUS

## 2022-12-20 MED ORDER — LIDOCAINE 2% (20 MG/ML) 5 ML SYRINGE
INTRAMUSCULAR | Status: DC | PRN
Start: 2022-12-20 — End: 2022-12-20
  Administered 2022-12-20: 60 mg via INTRAVENOUS

## 2022-12-20 MED ORDER — FENTANYL CITRATE (PF) 100 MCG/2ML IJ SOLN
INTRAMUSCULAR | Status: AC
  Filled 2022-12-20: qty 2

## 2022-12-20 MED ORDER — LACTATED RINGERS IV SOLN: INTRAVENOUS | Status: DC

## 2022-12-20 MED ORDER — SUGAMMADEX SODIUM 200 MG/2ML IV SOLN
INTRAVENOUS | Status: DC | PRN
Start: 2022-12-20 — End: 2022-12-20
  Administered 2022-12-20: 150 mg via INTRAVENOUS

## 2022-12-20 NOTE — TOC Progression Note (Signed)
Transition of Care [facility]) - Progression Note    Patient Details  Name: Beverly Stewart MRN: [id number] Date of Birth: September 10, 1985  Transition of [facility]) CM/SW Contact  Adrian Prows, RN Phone Number: 12/20/2022, 3:06 PM  Clinical Narrative:    Pt in procedural area; no family at bedside; unable to complete TOC assessment.        Expected Discharge Plan and Services                                               Social Determinants of Health (SDOH) Interventions SDOH Screenings   Food Insecurity: No Food Insecurity (12/19/2022)  Housing: Low Risk  (12/19/2022)  Transportation Needs: No Transportation Needs (12/19/2022)  Utilities: Not At Risk (12/19/2022)  Financial Resource Strain: Low Risk  (11/13/2022)   Received from [facility]  Physical Activity: Unknown (07/05/2021)   Received from [facility], [facility]  Social Connections: Unknown (07/06/2022)   Received from [facility]  Stress: No Stress Concern Present (07/05/2021)   Received from [facility], [facility]  Tobacco Use: Medium Risk (12/19/2022)    Readmission Risk Interventions    10/22/2022    8:55 AM  Readmission Risk Prevention Plan  Post Dischage Appt Complete  Medication Screening Complete  Transportation Screening Complete

## 2022-12-20 NOTE — Progress Notes (Signed)
MD Maisie Fus was paged to ask if patient's diet could be changed since she was NPO for ERCP.

## 2022-12-20 NOTE — Progress Notes (Signed)
1 Day Post-Op   Subjective/Chief Complaint: She reports only mild pain this morning Had some nausea with liquids   Objective: Vital signs in last 24 hours: Temp:  [97.4 F (36.3 C)-98.4 F (36.9 C)] 98.4 F (36.9 C) (10/03 0602) Pulse Rate:  [54-89] 64 (10/03 0602) Resp:  [14-18] 18 (10/03 0602) BP: (111-139)/(64-96) 111/64 (10/03 0602) SpO2:  [97 %-100 %] 99 % (10/03 0602) Last BM Date : 12/16/22  Intake/Output from previous day: 10/02 0701 - 10/03 0700 In: 3480.5 [P.O.:1140; I.V.:2240.5; IV Piggyback:100] Out: 1010 [Urine:1000; Blood:10] Intake/Output this shift: No intake/output data recorded.  Exam: Awake and alert Looks well Abdomen soft, non-distended, incisions clean, minimally tender  Lab Results:  Recent Labs    12/19/22 1627 12/20/22 0432  WBC 7.4 8.0  HGB 12.4 11.9*  HCT 38.0 36.7  PLT 232 206   BMET Recent Labs    12/19/22 1627 12/20/22 0432  NA 137 138  K 3.8 4.0  CL 107 106  CO2 22 22  GLUCOSE 126* 100*  BUN 8 8  CREATININE 0.62 0.66  CALCIUM 9.1 9.0   PT/INR No results for input(s): "LABPROT", "INR" in the last 72 hours. ABG No results for input(s): "PHART", "HCO3" in the last 72 hours.  Invalid input(s): "PCO2", "PO2"  Studies/Results: DG Cholangiogram Operative  Result Date: 12/19/2022 CLINICAL DATA:  Cholelithiasis, elective cholecystectomy EXAM: INTRAOPERATIVE CHOLANGIOGRAM TECHNIQUE: Cholangiographic images from the C-arm fluoroscopic device were submitted for interpretation post-operatively. Please see the procedural report for the amount of contrast and the fluoroscopy time utilized. FLUOROSCOPY: Radiation Exposure Index (as provided by the fluoroscopic device): 6 mGy Kerma COMPARISON:  10/21/2022, 10/31/2022 FINDINGS: Intraoperative cholangiogram performed during laparoscopic procedure. There is mild diffuse biliary tree dilatation. Small persistent distal CBD filling defect without spontaneous drainage into the duodenum.  Appearance concerning for distal CBD stone (choledocholithiasis). IMPRESSION: Mild diffuse biliary dilatation and suspect distal CBD stone as above. These results were called by telephone at the time of interpretation on 12/19/2022 at 9:28 am to provider [facility] , who verbally acknowledged these results. Electronically Signed   By: Judie Petit.  Shick M.D.   On: 12/19/2022 09:28    Anti-infectives: Anti-infectives (From admission, onward)    Start     Dose/Rate Route Frequency Ordered Stop   12/19/22 0645  ceFAZolin (ANCEF) IVPB 2g/100 mL premix        2 g 200 mL/hr over 30 Minutes Intravenous On call to O.R. 12/19/22 4782 12/19/22 0846       Assessment/Plan: POD#1 s/p lap chole with possible CBD stone seen on cholangiogram  Appreciated GI's help.    At time of surgery, her liver looked normal with no gross signs of cirrhosis or other abnormalities. Given c-gram findings, ERCP is planned for today.  Depending results, further workup of her liver may be necessary    Abigail Miyamoto MD 12/20/2022

## 2022-12-20 NOTE — Progress Notes (Addendum)
Beverly Stewart Progress Note  CC:  S/P laparoscopic cholecystectomy with positive IOC  Subjective: She remains NPO.  No nausea or vomiting.  She has her lower abdominal soreness, no severe abdominal pain.  No bowel movement since her cholecystectomy but she is passing gas per the rectum.  She is urinating adequate amounts of urine.  No chest pain or shortness of breath.   Objective:  Vital signs in last 24 hours: Temp:  [97.4 F (36.3 C)-98.4 F (36.9 C)] 98.4 F (36.9 C) (10/03 0602) Pulse Rate:  [54-89] 64 (10/03 0602) Resp:  [14-18] 18 (10/03 0602) BP: (111-139)/(64-96) 111/64 (10/03 0602) SpO2:  [97 %-100 %] 99 % (10/03 0602) Last BM Date : 12/16/22 General: 37 year old female in no acute distress. Heart: Regular rate and rhythm, no murmurs. Pulm: Breath sounds clear throughout. Abdomen: Soft, nondistended.  Generalized tenderness throughout all 4 quadrants. Laparoscopic incisions closed without erythema or exudate.  Hypoactive bowel sounds to all 4 quadrants. Extremities: No edema.SCDs in use.  Neurologic:  Alert and oriented x 4. Grossly normal neurologically. Psych:  Alert and cooperative. Normal mood and affect.  Intake/Output from previous day: 10/02 0701 - 10/03 0700 In: 3480.5 [P.O.:1140; I.V.:2240.5; IV Piggyback:100] Out: 1010 [Urine:1000; Blood:10] Intake/Output this shift: No intake/output data recorded.  Lab Results: Recent Labs    12/19/22 1627 12/20/22 0432  WBC 7.4 8.0  HGB 12.4 11.9*  HCT 38.0 36.7  PLT 232 206   BMET Recent Labs    12/19/22 1627 12/20/22 0432  NA 137 138  K 3.8 4.0  CL 107 106  CO2 22 22  GLUCOSE 126* 100*  BUN 8 8  CREATININE 0.62 0.66  CALCIUM 9.1 9.0   LFT Recent Labs    12/20/22 0432  PROT 6.2*  ALBUMIN 3.4*  AST 745*  ALT 1,213*  ALKPHOS 59  BILITOT 1.7*   PT/INR No results for input(s): "LABPROT", "INR" in the last 72 hours. Hepatitis Panel No results for input(s): "HEPBSAG", "HCVAB",  "HEPAIGM", "HEPBIGM" in the last 72 hours.  DG Cholangiogram Operative  Result Date: 12/19/2022 CLINICAL DATA:  Cholelithiasis, elective cholecystectomy EXAM: INTRAOPERATIVE CHOLANGIOGRAM TECHNIQUE: Cholangiographic images from the C-arm fluoroscopic device were submitted for interpretation post-operatively. Please see the procedural report for the amount of contrast and the fluoroscopy time utilized. FLUOROSCOPY: Radiation Exposure Index (as provided by the fluoroscopic device): 6 mGy Kerma COMPARISON:  10/21/2022, 10/31/2022 FINDINGS: Intraoperative cholangiogram performed during laparoscopic procedure. There is mild diffuse biliary tree dilatation. Small persistent distal CBD filling defect without spontaneous drainage into the duodenum. Appearance concerning for distal CBD stone (choledocholithiasis). IMPRESSION: Mild diffuse biliary dilatation and suspect distal CBD stone as above. These results were called by telephone at the time of interpretation on 12/19/2022 at 9:28 am to provider Beverly Stewart , who verbally acknowledged these results. Electronically Signed   By: Judie Petit.  Shick M.D.   On: 12/19/2022 09:28    Assessment / Plan:  37 year old female status post laparoscopic cholecystectomy 12/19/2022, positive IOC with mild diffuse dilation of the common bile duct and suspicion for distal common bile duct stone.  Elevated LFTs. T. Bili 1.8 -> 1.7. Alk phos 59. AST 1,315 -> 745. ALT 1,166 -> 1,213. WBC 8.0.  Hemoglobin 11.9 down from 12.4, likely dilutional from IV fluids.  Afebrile.  Hemodynamically stable. -Proceed with ERCP this afternoon with Dr. Marina Stewart as scheduled -NPO  -IV fluids and pain management per the hospitalist  -Ondansetron 4 mg IV every 6 hours as needed -  Hold Lovenox today   Principal Problem:   S/P laparoscopic cholecystectomy     LOS: 0 days   Beverly Stewart  12/20/2022, 9:09 AM  GI ATTENDING  Interval history and data reviewed.  Patient for ERCP this  afternoon.  She will be evaluated at the bedside preprocedure in the endoscopy suite.  Beverly Stewart. Eda Keys., M.D. [facility]

## 2022-12-20 NOTE — Op Note (Signed)
[facility] Patient Name: Beverly Stewart Procedure Date: 12/20/2022 MRN: [id number] Attending MD: Wilhemina Bonito. Marina Goodell , MD, [id number] Date of Birth: May 24, 1985 CSN: [id number] Age: 37 Admit Type: Inpatient Procedure:                ERCP with biliary sphincterotomy Indications:              Possible bile duct stone with obstruction on IOC,                            Abnormal liver function test Providers:                Wilhemina Bonito. Marina Goodell, MD, Fransisca Connors, Rozetta Nunnery, Technician Referring MD:             [facility] surgery Medicines:                General Anesthesia Complications:            No immediate complications. Estimated Blood Loss:     Estimated blood loss: none. Procedure:                Pre-Anesthesia Assessment:                           - Prior to the procedure, a History and Physical                            was performed, and patient medications and                            allergies were reviewed. The patient is competent.                            The risks and benefits of the procedure and the                            sedation options and risks were discussed with the                            patient. All questions were answered and informed                            consent was obtained. Patient identification and                            proposed procedure were verified by the physician.                            Mental Status Examination: alert and oriented.                            Airway Examination: normal oropharyngeal airway and  neck mobility. Respiratory Examination: clear to                            auscultation. CV Examination: normal. Prophylactic                            Antibiotics: The patient does not require                            prophylactic antibiotics. Prior Anticoagulants: The                            patient has taken no anticoagulant or  antiplatelet                            agents. ASA Grade Assessment: I - A normal, healthy                            patient. After reviewing the risks and benefits,                            the patient was deemed in satisfactory condition to                            undergo the procedure. The anesthesia plan was to                            use moderate sedation / analgesia (conscious                            sedation). Immediately prior to administration of                            medications, the patient was re-assessed for                            adequacy to receive sedatives. The heart rate,                            respiratory rate, oxygen saturations, blood                            pressure, adequacy of pulmonary ventilation, and                            response to care were monitored throughout the                            procedure. The physical status of the patient was                            re-assessed after the procedure.  After obtaining informed consent, the scope was                            passed under direct vision. Throughout the                            procedure, the patient's blood pressure, pulse, and                            oxygen saturations were monitored continuously. The                            TJF-Q190V ([id number] Olympus duodenoscope was                            introduced through the mouth, and used to inject                            contrast into and used to inject contrast into the                            bile duct. The ERCP was accomplished without                            difficulty. The patient tolerated the procedure                            well. Scope In: Scope Out: Findings:      1. The side-viewing duodena scope was passed blindly into the esophagus.       The stomach and duodenum were grossly normal. The major ampulla was       normal. The minor ampulla was not sought.       2. A scout radiograph of the abdomen with the endoscope and position       revealed cholecystectomy clips      3. Initial injection of contrast filled the biliary tree. Deep       cannulation was achieved with guidewire assistance. Complete filling of       the biliary tree did not reveal significant dilation. No obvious filling       defect.      4. Biliary sphincterotomy was made with a sphincterotome using ERBE       electrocautery. There was no post-sphincterotomy bleeding. Multiple       balloon sweeps were negative for stones or debris. Drainage was excellent      5. There was no injection or manipulation of the pancreatic duct. Impression:               1. Status post ERC with sphincterotomy                           2. No ductal stones or other abnormalities.                            Excellent drainage post sphincterotomy  3. Status postcholecystectomy                           4. Elevated liver tests Moderate Sedation:      none Recommendation:           1. Standard observation post ERCP                           2. Trend liver test                           3. If doing well tomorrow, can go home. She will                            need a routine postop surgical follow-up with                            follow-up blood work at that time. If her liver                            tests improved, continue to trend. However, if                            liver test abnormalities persist for some time,                            then refer back to primary GI for further                            evaluation.                           Discussed with the patient. She was provided a copy                            of this report. Procedure Code(s):        --- Professional ---                           [id number], Endoscopic retrograde                            cholangiopancreatography (ERCP); with                            sphincterotomy/papillotomy Diagnosis  Code(s):        --- Professional ---                           K80.50, Calculus of bile duct without cholangitis                            or cholecystitis without obstruction                           R79.89, Other specified abnormal findings of blood  chemistry CPT copyright 2022 [employer]. All rights reserved. The codes documented in this report are preliminary and upon coder review may  be revised to meet current compliance requirements. Wilhemina Bonito. Marina Goodell, MD 12/20/2022 6:16:54 PM This report has been signed electronically. Number of Addenda: 0

## 2022-12-20 NOTE — Progress Notes (Addendum)
Verbal order to change patient's diet to previous diet before NPO by MD Maisie Fus.

## 2022-12-20 NOTE — Transfer of Care (Signed)
Immediate Anesthesia Transfer of Care Note  Patient: Beverly Stewart  Procedure(s) Performed: ENDOSCOPIC RETROGRADE CHOLANGIOPANCREATOGRAPHY (ERCP) SPHINCTEROTOMY  Patient Location: PACU  Anesthesia Type:General  Level of Consciousness: oriented and drowsy  Airway & Oxygen Therapy: Patient Spontanous Breathing and Patient connected to nasal cannula oxygen  Post-op Assessment: Report given to RN and Post -op Vital signs reviewed and stable  Post vital signs: Reviewed and stable  Last Vitals:  Vitals Value Taken Time  BP 129/90 12/20/22 1827  Temp    Pulse 74 12/20/22 1829  Resp 13 12/20/22 1830  SpO2 100 % 12/20/22 1829  Vitals shown include unfiled device data.  Last Pain:  Vitals:   12/20/22 1400  TempSrc: Temporal  PainSc: 5       Patients Stated Pain Goal: 2 (12/20/22 1000)  Complications: No notable events documented.

## 2022-12-20 NOTE — Anesthesia Postprocedure Evaluation (Signed)
Anesthesia Post Note  Patient: Beverly Stewart  Procedure(s) Performed: ENDOSCOPIC RETROGRADE CHOLANGIOPANCREATOGRAPHY (ERCP) SPHINCTEROTOMY     Patient location during evaluation: PACU Anesthesia Type: General Level of consciousness: awake and alert Pain management: pain level controlled Vital Signs Assessment: post-procedure vital signs reviewed and stable Respiratory status: spontaneous breathing, nonlabored ventilation, respiratory function stable and patient connected to nasal cannula oxygen Cardiovascular status: blood pressure returned to baseline and stable Postop Assessment: no apparent nausea or vomiting Anesthetic complications: no  No notable events documented.  Last Vitals:  Vitals:   12/20/22 1825 12/20/22 1830  BP: (!) 123/94 (!) 129/90  Pulse: 85 74  Resp: 14 13  Temp: (!) 36.4 C   SpO2: 100% 100%    Last Pain:  Vitals:   12/20/22 1825  TempSrc:   PainSc: 0-No pain                 [name]

## 2022-12-20 NOTE — Plan of Care (Signed)

## 2022-12-20 NOTE — Anesthesia Procedure Notes (Signed)
Procedure Name: Intubation Date/Time: 12/20/2022 5:17 PM  Performed by: Nathen May, CRNAPre-anesthesia Checklist: Patient identified, Emergency Drugs available, Suction available and Patient being monitored Patient Re-evaluated:Patient Re-evaluated prior to induction Oxygen Delivery Method: Circle System Utilized Preoxygenation: Pre-oxygenation with 100% oxygen Induction Type: IV induction Ventilation: Mask ventilation without difficulty Laryngoscope Size: Mac and 3 Tube type: Oral Number of attempts: 1 Airway Equipment and Method: Stylet and Oral airway Placement Confirmation: ETT inserted through vocal cords under direct vision, positive ETCO2 and breath sounds checked- equal and bilateral Tube secured with: Tape Dental Injury: Teeth and Oropharynx as per pre-operative assessment

## 2022-12-20 NOTE — Plan of Care (Signed)
  Problem: Education: Goal: Knowledge of General Education information will improve Description: Including pain rating scale, medication(s)/side effects and non-pharmacologic comfort measures Outcome: Progressing   Problem: Clinical Measurements: Goal: Ability to maintain clinical measurements within normal limits will improve Outcome: Progressing   

## 2022-12-21 LAB — COMPREHENSIVE METABOLIC PANEL WITH GFR
ALT: 788 U/L — ABNORMAL HIGH (ref 0–44)
AST: 222 U/L — ABNORMAL HIGH (ref 15–41)
Albumin: 3.5 g/dL (ref 3.5–5.0)
Alkaline Phosphatase: 56 U/L (ref 38–126)
Anion gap: 10 (ref 5–15)
BUN: 9 mg/dL (ref 6–20)
CO2: 20 mmol/L — ABNORMAL LOW (ref 22–32)
Calcium: 8.7 mg/dL — ABNORMAL LOW (ref 8.9–10.3)
Chloride: 105 mmol/L (ref 98–111)
Creatinine, Ser: 0.76 mg/dL (ref 0.44–1.00)
GFR, Estimated: >60 mL/min
Glucose, Bld: 96 mg/dL (ref 70–99)
Potassium: 4 mmol/L (ref 3.5–5.1)
Sodium: 135 mmol/L (ref 135–145)
Total Bilirubin: 1.8 mg/dL — ABNORMAL HIGH (ref 0.3–1.2)
Total Protein: 6.4 g/dL — ABNORMAL LOW (ref 6.5–8.1)

## 2022-12-21 MED ORDER — TRAMADOL HCL 50 MG PO TABS: 50.0000 mg | ORAL_TABLET | Freq: Four times a day (QID) | ORAL | 0 refills | Status: AC | PRN

## 2022-12-21 NOTE — Progress Notes (Signed)
PACU cardiac monitoring order was discontinued.

## 2022-12-21 NOTE — Plan of Care (Signed)
  Problem: Education: Goal: Knowledge of General Education information will improve Description: Including pain rating scale, medication(s)/side effects and non-pharmacologic comfort measures Outcome: Progressing   Problem: Pain Managment: Goal: General experience of comfort will improve Outcome: Progressing   

## 2022-12-21 NOTE — Discharge Instructions (Signed)
[facility] ______[facility] LAPAROSCOPIC SURGERY: POST OP INSTRUCTIONS Always review your discharge instruction sheet given to you by the facility where your surgery was performed. IF YOU HAVE DISABILITY OR FAMILY LEAVE FORMS, YOU MUST BRING THEM TO THE OFFICE FOR PROCESSING.   DO NOT GIVE THEM TO YOUR DOCTOR.  A prescription for pain medication may be given to you upon discharge.  Take your pain medication as prescribed, if needed.  If narcotic pain medicine is not needed, then you may take acetaminophen (Tylenol) or ibuprofen (Advil) as needed. Take your usually prescribed medications unless otherwise directed. If you need a refill on your pain medication, please contact your pharmacy.  They will contact our office to request authorization. Prescriptions will not be filled after 5pm or on week-ends. You should follow a light diet the first few days after arrival home, such as soup and crackers, etc.  Be sure to include lots of fluids daily. Most patients will experience some swelling and bruising in the area of the incisions.  Ice packs will help.  Swelling and bruising can take several days to resolve.  It is common to experience some constipation if taking pain medication after surgery.  Increasing fluid intake and taking a stool softener (such as Colace) will usually help or prevent this problem from occurring.  A mild laxative (Milk of Magnesia or Miralax) should be taken according to package instructions if there are no bowel movements after 48 hours. Unless discharge instructions indicate otherwise, you may remove your bandages 24-48 hours after surgery, and you may shower at that time.  You may have steri-strips (small skin tapes) in place directly over the incision.  These strips should be left on the skin for 7-10 days.  If your surgeon used skin glue on the incision, you may shower in 24 hours.  The glue will flake off over the next 2-3 weeks.  Any sutures or staples will be  removed at the office during your follow-up visit. ACTIVITIES:  You may resume regular (light) daily activities beginning the next day--such as daily self-care, walking, climbing stairs--gradually increasing activities as tolerated.  You may have sexual intercourse when it is comfortable.  Refrain from any heavy lifting or straining until approved by your doctor. You may drive when you are no longer taking prescription pain medication, you can comfortably wear a seatbelt, and you can safely maneuver your car and apply brakes. RETURN TO WORK:  __________________________________________________________ Beverly Stewart should see your doctor in the office for a follow-up appointment approximately 2-3 weeks after your surgery.  Make sure that you call for this appointment within a day or two after you arrive home to insure a convenient appointment time. OTHER INSTRUCTIONS:OK TO SHOWER NO LIFTING MORE THAN 15 POUNDS FOR 2 WEEKS FROM THE DAY OF SURGERY __________________________________________________________________________________________________________________________ __________________________________________________________________________________________________________________________ WHEN TO CALL YOUR DOCTOR: Fever over 101.0 Inability to urinate Continued bleeding from incision. Increased pain, redness, or drainage from the incision. Increasing abdominal pain  The clinic staff is available to answer your questions during regular business hours.  Please don't hesitate to call and ask to speak to one of the nurses for clinical concerns.  If you have a medical emergency, go to the nearest emergency room or call 911.  A surgeon from [facility] is always on call at the hospital. [address], [place], [place]  [address] ? [address] [address], [place], [place]   [address] [phone] ? [phone] ? FAX [phone] Web site: [email or web address]

## 2022-12-21 NOTE — Progress Notes (Signed)
Pt was discharged home today. Instructions were reviewed with patient, and questions were answered. Pt was taken to main entrance via wheelchair by NT.  

## 2022-12-21 NOTE — Plan of Care (Signed)

## 2022-12-21 NOTE — Discharge Summary (Signed)
Physician Discharge Summary  Patient ID: Beverly Stewart MRN: [id number] DOB/AGE: [birth year] 35 y.o.  Admit date: 12/19/2022 Discharge date: 12/21/2022  Admission Diagnoses:  Discharge Diagnoses:  Principal Problem:   S/P laparoscopic cholecystectomy Active Problems:   Elevated liver transaminase level   Choledocholithiasis   Discharged Condition: good  Hospital Course: admitted after lap chole with suspicion of CBD stone.  GI saw pt and she underwent an unremarkable ERCP.  Doing well with improved labs so discharged home POD#2  Consults: GI  Significant Diagnostic Studies:   Treatments: surgery: lap chole with C-gram, ERCP  Discharge Exam: Blood pressure 113/73, pulse 64, temperature 98.4 F (36.9 C), resp. rate 20, height 5\' 6"  (1.676 m), weight 57.6 kg, last menstrual period 12/15/2022, SpO2 99%, not currently breastfeeding. General appearance: alert, cooperative, and no distress Resp: clear to auscultation bilaterally Cardio: regular rate and rhythm, S1, S2 normal, no murmur, click, rub or gallop Incision/Wound: abdomen soft, incision clean  Disposition: Discharge disposition: 01-Home or Self Care        Allergies as of 12/21/2022   No Known Allergies      Medication List     STOP taking these medications    oxyCODONE-acetaminophen 5-325 MG tablet Commonly known as: Percocet       TAKE these medications    acetaminophen 325 MG tablet Commonly known as: Tylenol Take 2 tablets (650 mg total) by mouth every 6 (six) hours as needed (for pain scale < 4).   CLEAR EYES OP Place 1-2 drops into both eyes as needed (dry eye).   dicyclomine 20 MG tablet Commonly known as: BENTYL Take 1 tablet (20 mg total) by mouth 2 (two) times daily.   famotidine 20 MG tablet Commonly known as: Pepcid Take 1 tablet (20 mg total) by mouth 2 (two) times daily.   fluticasone 50 MCG/ACT nasal spray Commonly known as: FLONASE Place 1 spray into both nostrils daily. Begin  by using 2 sprays in each nare daily for 3 to 5 days, then decrease to 1 spray in each nare daily. What changed:  when to take this reasons to take this additional instructions   omeprazole 20 MG capsule Commonly known as: PRILOSEC Take 1 capsule (20 mg total) by mouth daily.   ondansetron 4 MG tablet Commonly known as: ZOFRAN Take 1 tablet (4 mg total) by mouth every 8 (eight) hours as needed for nausea or vomiting.   traMADol 50 MG tablet Commonly known as: Ultram Take 1 tablet (50 mg total) by mouth every 6 (six) hours as needed for moderate pain or severe pain.        Follow-up Information     Abigail Miyamoto, MD. Schedule an appointment as soon as possible for a visit in 3 week(s).   Specialty: General Surgery Contact information: [address] [place] [address] [phone]                 Signed: Abigail Miyamoto 12/21/2022, 7:47 AM

## 2022-12-21 NOTE — Progress Notes (Signed)
1 Day Post-Op   Subjective/Chief Complaint: No complaints She feels much better and wants to go home Tolerating po   Objective: Vital signs in last 24 hours: Temp:  [97.5 F (36.4 C)-98.6 F (37 C)] 98.4 F (36.9 C) (10/04 0537) Pulse Rate:  [54-85] 64 (10/04 0537) Resp:  [13-20] 20 (10/04 0537) BP: (110-141)/(67-94) 113/73 (10/04 0537) SpO2:  [99 %-100 %] 99 % (10/04 0537) Last BM Date : 12/16/22  Intake/Output from previous day: 10/03 0701 - 10/04 0700 In: 2241.8 [P.O.:360; I.V.:1781.8; IV Piggyback:100] Out: 2301 [Urine:2301] Intake/Output this shift: No intake/output data recorded.  Exam: Awake and alert Abdomen soft, minimally tender Incisions clean  Lab Results:  Recent Labs    12/19/22 1627 12/20/22 0432  WBC 7.4 8.0  HGB 12.4 11.9*  HCT 38.0 36.7  PLT 232 206   BMET Recent Labs    12/20/22 0432 12/21/22 0442  NA 138 135  K 4.0 4.0  CL 106 105  CO2 22 20*  GLUCOSE 100* 96  BUN 8 9  CREATININE 0.66 0.76  CALCIUM 9.0 8.7*   PT/INR No results for input(s): "LABPROT", "INR" in the last 72 hours. ABG No results for input(s): "PHART", "HCO3" in the last 72 hours.  Invalid input(s): "PCO2", "PO2"  Studies/Results: DG ERCP  Result Date: 12/20/2022 CLINICAL DATA:  Suspected choledocholithiasis on intra op cholangiogram EXAM: ERCP TECHNIQUE: Multiple spot images obtained with the fluoroscopic device and submitted for interpretation post-procedure. COMPARISON:  the previous day's study FINDINGS: A series of fluoroscopic spot images document endoscopic cannulation and opacification of the CBD with balloon catheter passage. The intrahepatic ducts are incompletely visualized, appearing nondilated centrally. IMPRESSION: Endoscopic CBD cannulation and intervention as above. Electronically Signed   By: Corlis Leak M.D.   On: 12/20/2022 21:17   DG Cholangiogram Operative  Result Date: 12/19/2022 CLINICAL DATA:  Cholelithiasis, elective cholecystectomy EXAM:  INTRAOPERATIVE CHOLANGIOGRAM TECHNIQUE: Cholangiographic images from the C-arm fluoroscopic device were submitted for interpretation post-operatively. Please see the procedural report for the amount of contrast and the fluoroscopy time utilized. FLUOROSCOPY: Radiation Exposure Index (as provided by the fluoroscopic device): 6 mGy Kerma COMPARISON:  10/21/2022, 10/31/2022 FINDINGS: Intraoperative cholangiogram performed during laparoscopic procedure. There is mild diffuse biliary tree dilatation. Small persistent distal CBD filling defect without spontaneous drainage into the duodenum. Appearance concerning for distal CBD stone (choledocholithiasis). IMPRESSION: Mild diffuse biliary dilatation and suspect distal CBD stone as above. These results were called by telephone at the time of interpretation on 12/19/2022 at 9:28 am to provider [facility] , who verbally acknowledged these results. Electronically Signed   By: Judie Petit.  Shick M.D.   On: 12/19/2022 09:28    Anti-infectives: Anti-infectives (From admission, onward)    Start     Dose/Rate Route Frequency Ordered Stop   12/20/22 1045  Ampicillin-Sulbactam (UNASYN) 3 g in sodium chloride 0.9 % 100 mL IVPB        3 g 200 mL/hr over 30 Minutes Intravenous  Once 12/20/22 0953 12/20/22 1112   12/19/22 0645  ceFAZolin (ANCEF) IVPB 2g/100 mL premix        2 g 200 mL/hr over 30 Minutes Intravenous On call to O.R. 12/19/22 1610 12/19/22 0846       Assessment/Plan: S/p lap chole with c-gram and then ERCP  ERCP results noted.  Again, appreciate GI's help  Will discharge home Plan to repeat LFT's as an outpt Patient agrees with the plans  Abigail Miyamoto 12/21/2022

## 2022-12-24 ENCOUNTER — Encounter (HOSPITAL_COMMUNITY): Payer: Self-pay | Admitting: Internal Medicine
